# Patient Record
Sex: Male | Born: 1985 | Hispanic: Yes | Marital: Single | Smoking: Current some day smoker
Health system: Southern US, Community
[De-identification: ages and names within clinical notes are randomized; demographics above are authoritative.]

## PROBLEM LIST (undated history)

## (undated) ENCOUNTER — Ambulatory Visit: Admission: EM | Discharge status: Absent without leave | Payer: Federal, State, Local not specified - PPO

## (undated) DIAGNOSIS — G8929 Other chronic pain: Secondary | ICD-10-CM

## (undated) DIAGNOSIS — G43909 Migraine, unspecified, not intractable, without status migrainosus: Secondary | ICD-10-CM

## (undated) DIAGNOSIS — M549 Dorsalgia, unspecified: Secondary | ICD-10-CM

## (undated) HISTORY — PX: BACK SURGERY: SHX140

## (undated) HISTORY — PX: MOUTH SURGERY: SHX715

## (undated) HISTORY — PX: SHOULDER SURGERY: SHX246

---

## 2008-10-23 ENCOUNTER — Emergency Department: Payer: Self-pay | Admitting: Emergency Medicine

## 2013-03-28 HISTORY — PX: SEPTOPLASTY: SUR1290

## 2013-03-28 HISTORY — PX: RHINOPLASTY: SUR1284

## 2014-04-19 ENCOUNTER — Emergency Department (HOSPITAL_COMMUNITY): Payer: Self-pay

## 2014-04-19 ENCOUNTER — Encounter (HOSPITAL_COMMUNITY): Payer: Self-pay

## 2014-04-19 ENCOUNTER — Emergency Department (HOSPITAL_COMMUNITY)
Admission: EM | Admit: 2014-04-19 | Discharge: 2014-04-19 | Disposition: A | Discharge status: Home / Self Care | Payer: Self-pay | Attending: Emergency Medicine | Admitting: Emergency Medicine

## 2014-04-19 DIAGNOSIS — S43421A Sprain of right rotator cuff capsule, initial encounter: Secondary | ICD-10-CM | POA: Insufficient documentation

## 2014-04-19 DIAGNOSIS — S3992XA Unspecified injury of lower back, initial encounter: Secondary | ICD-10-CM | POA: Insufficient documentation

## 2014-04-19 DIAGNOSIS — S46911A Strain of unspecified muscle, fascia and tendon at shoulder and upper arm level, right arm, initial encounter: Secondary | ICD-10-CM | POA: Insufficient documentation

## 2014-04-19 DIAGNOSIS — T1490XA Injury, unspecified, initial encounter: Secondary | ICD-10-CM

## 2014-04-19 DIAGNOSIS — S4991XA Unspecified injury of right shoulder and upper arm, initial encounter: Secondary | ICD-10-CM

## 2014-04-19 DIAGNOSIS — Y998 Other external cause status: Secondary | ICD-10-CM | POA: Insufficient documentation

## 2014-04-19 DIAGNOSIS — S0003XA Contusion of scalp, initial encounter: Secondary | ICD-10-CM | POA: Insufficient documentation

## 2014-04-19 DIAGNOSIS — Z79899 Other long term (current) drug therapy: Secondary | ICD-10-CM | POA: Insufficient documentation

## 2014-04-19 DIAGNOSIS — Y92410 Unspecified street and highway as the place of occurrence of the external cause: Secondary | ICD-10-CM | POA: Insufficient documentation

## 2014-04-19 DIAGNOSIS — S40211A Abrasion of right shoulder, initial encounter: Secondary | ICD-10-CM | POA: Insufficient documentation

## 2014-04-19 DIAGNOSIS — Y9389 Activity, other specified: Secondary | ICD-10-CM | POA: Insufficient documentation

## 2014-04-19 DIAGNOSIS — S46011A Strain of muscle(s) and tendon(s) of the rotator cuff of right shoulder, initial encounter: Secondary | ICD-10-CM

## 2014-04-19 DIAGNOSIS — S199XXA Unspecified injury of neck, initial encounter: Secondary | ICD-10-CM | POA: Insufficient documentation

## 2014-04-19 DIAGNOSIS — Z87891 Personal history of nicotine dependence: Secondary | ICD-10-CM | POA: Insufficient documentation

## 2014-04-19 MED ORDER — OXYCODONE-ACETAMINOPHEN 5-325 MG PO TABS: 1.0000 | ORAL_TABLET | Freq: Four times a day (QID) | ORAL | Status: DC | PRN

## 2014-04-19 MED ORDER — IBUPROFEN 800 MG PO TABS: 800.0000 mg | ORAL_TABLET | Freq: Three times a day (TID) | ORAL | Status: DC

## 2014-04-19 MED ORDER — OXYCODONE-ACETAMINOPHEN 5-325 MG PO TABS
2.0000 | ORAL_TABLET | Freq: Once | ORAL | Status: AC
  Administered 2014-04-19: 2 via ORAL
  Filled 2014-04-19: qty 2

## 2014-04-19 NOTE — ED Notes (Signed)
Patient returned from CT

## 2014-04-19 NOTE — Discharge Instructions (Signed)
Take motrin for pain.   Take percocet for severe pain. Do NOT drive with it.   No heavy lifting until you see ortho.   Use sling for comfort.   Return to ED if you have severe pain, vomiting, worse shoulder pain.
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
## 2014-04-19 NOTE — ED Notes (Addendum)
Patient transported to Radiology 

## 2014-08-12 ENCOUNTER — Emergency Department
Admission: EM | Admit: 2014-08-12 | Discharge: 2014-08-12 | Disposition: A | Discharge status: Home / Self Care | Payer: Self-pay | Attending: Emergency Medicine | Admitting: Emergency Medicine

## 2014-08-12 ENCOUNTER — Encounter: Payer: Self-pay | Admitting: Emergency Medicine

## 2014-08-12 DIAGNOSIS — Y998 Other external cause status: Secondary | ICD-10-CM | POA: Insufficient documentation

## 2014-08-12 DIAGNOSIS — Z87891 Personal history of nicotine dependence: Secondary | ICD-10-CM | POA: Insufficient documentation

## 2014-08-12 DIAGNOSIS — Y92009 Unspecified place in unspecified non-institutional (private) residence as the place of occurrence of the external cause: Secondary | ICD-10-CM | POA: Insufficient documentation

## 2014-08-12 DIAGNOSIS — Z791 Long term (current) use of non-steroidal anti-inflammatories (NSAID): Secondary | ICD-10-CM | POA: Insufficient documentation

## 2014-08-12 DIAGNOSIS — T7840XA Allergy, unspecified, initial encounter: Secondary | ICD-10-CM

## 2014-08-12 DIAGNOSIS — T782XXA Anaphylactic shock, unspecified, initial encounter: Secondary | ICD-10-CM | POA: Insufficient documentation

## 2014-08-12 DIAGNOSIS — Y9389 Activity, other specified: Secondary | ICD-10-CM | POA: Insufficient documentation

## 2014-08-12 DIAGNOSIS — Z79899 Other long term (current) drug therapy: Secondary | ICD-10-CM | POA: Insufficient documentation

## 2014-08-12 DIAGNOSIS — X58XXXA Exposure to other specified factors, initial encounter: Secondary | ICD-10-CM | POA: Insufficient documentation

## 2014-08-12 HISTORY — DX: Other chronic pain: G89.29

## 2014-08-12 HISTORY — DX: Dorsalgia, unspecified: M54.9

## 2014-08-12 MED ORDER — PREDNISONE 20 MG PO TABS: ORAL_TABLET | ORAL | Status: DC

## 2014-08-12 MED ORDER — DIPHENHYDRAMINE HCL 50 MG/ML IJ SOLN
INTRAMUSCULAR | Status: AC
  Administered 2014-08-12: 12.5 mg via INTRAVENOUS
  Filled 2014-08-12: qty 1

## 2014-08-12 MED ORDER — SODIUM CHLORIDE 0.9 % IV SOLN
Freq: Once | INTRAVENOUS | Status: AC
  Administered 2014-08-12: 10:00:00 via INTRAVENOUS

## 2014-08-12 MED ORDER — DIPHENHYDRAMINE HCL 50 MG/ML IJ SOLN
12.5000 mg | Freq: Once | INTRAMUSCULAR | Status: AC
  Administered 2014-08-12: 12.5 mg via INTRAVENOUS

## 2014-08-12 MED ORDER — METHYLPREDNISOLONE SODIUM SUCC 40 MG IJ SOLR
INTRAMUSCULAR | Status: AC
  Administered 2014-08-12: 40 mg
  Filled 2014-08-12: qty 2

## 2014-08-12 MED ORDER — METHYLPREDNISOLONE SODIUM SUCC 125 MG IJ SOLR
80.0000 mg | Freq: Once | INTRAMUSCULAR | Status: AC
  Administered 2014-08-12: 80 mg via INTRAVENOUS

## 2014-08-12 MED ORDER — EPINEPHRINE 0.3 MG/0.3ML IJ SOAJ: 0.3000 mg | Freq: Once | INTRAMUSCULAR | Status: DC

## 2014-08-12 NOTE — ED Notes (Signed)
Pt with sudden onset of allergic reaction at 09:45 for unknown source however reports a "bump" behind right ear.  Pt appears in no resp distress, states he gave himself epi pen and 2 po benadryl. Redness noted to eyes bilaterally, trunk, face, and arms. Pts wife states he had hives on his back that are now gone.

## 2014-08-12 NOTE — Discharge Instructions (Signed)
Take prednisone as prescribed. Refill the EpiPen and keep your you if needed. Return to the emergency department if you're reaction begins to worsen, especially if it worsens to the point that she needed to use the EpiPen again.  Anaphylactic Reaction An anaphylactic reaction is a sudden, severe allergic reaction. It affects the whole body. It can be life threatening. You may need to stay in the hospital.  Chinle a medical bracelet or necklace that lists your allergy.  Carry your allergy kit or medicine shot to treat severe allergic reactions with you. These can save your life.  Do not drive until medicine from your shot has worn off, unless your doctor says it is okay.  If you have hives or a rash:  Take medicine as told by your doctor.  You may take over-the-counter antihistamine medicine.  Place cold cloths on your skin. Take baths in cool water. Avoid hot baths and hot showers. GET HELP RIGHT AWAY IF:   Your mouth is puffy (swollen), or you have trouble breathing.  You start making whistling sounds when you breathe (wheezing).  You have a tight feeling in your chest or throat.  You have a rash, hives, puffiness, or itching on your body.  You throw up (vomit) or have watery poop (diarrhea).  You feel dizzy or pass out (faint).  You think you are having an allergic reaction.  You have new symptoms. This is an emergency. Use your medicine shot or allergy kit as told. Call your local emergency services (911 in [place] Even if you feel better after the shot, you need to go to the hospital emergency department. MAKE SURE YOU:   Understand these instructions.  Will watch your condition.  Will get help right away if you are not doing well or get worse. Document Released: 08/31/2007 Document Revised: 09/13/2011 Document Reviewed: 06/15/2011 [facility] Patient Information 2015 [place], [place]. This information is not intended to replace advice given to you by your health  care provider. Make sure you discuss any questions you have with your health care provider.

## 2014-08-12 NOTE — ED Provider Notes (Signed)
[facility] Emergency Department Provider Note  ____________________________________________  Time seen: [id number]  I have reviewed the triage vital signs and the nursing notes.   HISTORY  Chief Complaint Allergic Reaction   HPI Steven Mccoy is a 29 y.o. 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 He has an EpiPen at home. This morning, just 15 minutes prior to arrival in the emergency department, he felt hives coming up and he began to turn red. He is not sure what triggered this. There was no bee string. He was on the floor and thought he might of had a bug bite. He noticed a bump on his scalp just above his right ear. He also reports that he has taken some naproxen and recently and he wonders if that may have triggered the reaction.The patient used his EpiPen at home and took 50 mg of Benadryl by mouth and came to the emergency department.   Past Medical History  Diagnosis Date  . Chronic back pain greater than 3 months duration     Right shoulder pain-chronic    There are no active problems to display for this patient.   Past Surgical History  Procedure Laterality Date  . Septoplasty  2015  . Rhinoplasty  2015    Current Outpatient Rx  Name  Route  Sig  Dispense  Refill  . buPROPion (ZYBAN) 150 MG 12 hr tablet   Oral   Take 150 mg by mouth 2 (two) times daily.         . citalopram (CELEXA) 20 MG tablet   Oral   Take 20 mg by mouth daily.         . cyclobenzaprine (FLEXERIL) 10 MG tablet   Oral   Take 1 tablet by mouth every 8 (eight) hours as needed. For muscle tightness      2   . meloxicam (MOBIC) 15 MG tablet   Oral   Take 1 tablet by mouth daily.      3   . traMADol (ULTRAM) 50 MG tablet   Oral   Take 50 mg by mouth every 6 (six) hours as needed. for pain      0   . zolpidem (AMBIEN) 10 MG tablet   Oral   Take 10 mg by mouth at bedtime as needed for sleep.         Marland Kitchen. EPINEPHrine 0.3  mg/0.3 mL IJ SOAJ injection   Intramuscular   Inject 0.3 mLs (0.3 mg total) into the muscle once.   1 Device   2   . oxyCODONE-acetaminophen (PERCOCET) 5-325 MG per tablet   Oral   Take 1 tablet by mouth every 6 (six) hours as needed.   10 tablet   0   . predniSONE (DELTASONE) 20 MG tablet      Take 2 tablets the first and second day,  then take one tablet a day for  more days.   7 tablet   0     Allergies Bee venom  History reviewed. No pertinent family history.  Social History History  Substance Use Topics  . Smoking status: Former Games developermoker  . Smokeless tobacco: Not on file  . Alcohol Use: No    Review of Systems  Constitutional: Negative for fever. ENT: Negative for sore throat. Cardiovascular: Negative for chest pain, notable for tachycardia on arrival see history of present illness. Respiratory: Negative for shortness of breath. Gastrointestinal: Negative for abdominal pain, vomiting and diarrhea.  Genitourinary: Negative for dysuria. Musculoskeletal: Negative for back pain. Skin: Negative for rash. Neurological: Negative for headaches   10-point ROS otherwise negative.  ____________________________________________   PHYSICAL EXAM:  VITAL SIGNS: ED Triage Vitals  Enc Vitals Group     BP --      Pulse --      Resp --      Temp --      Temp src --      SpO2 --      Weight --      Height --      Head Cir --      Peak Flow --      Pain Score --      Pain Loc --      Pain Edu? --      Excl. in GC? --     Constitutional: Alert and oriented. Mildly tachypnea, appears slightly uncomfortable, he is communicative. Elevated heart rate at 130 with normal blood pressure. ENT   Head: Normocephalic and atraumatic.   Nose: No congestion/rhinnorhea.   Mouth/Throat: Mucous membranes are moist. Cardiovascular: Tachycardic at 130  Respiratory: Normal respiratory effort without tachypnea. Breath sounds are clear and equal bilaterally. No  wheezes/rales/rhonchi. Gastrointestinal: Soft and nontender. No distention.  Back: There is no CVA tenderness. Musculoskeletal: Nontender with normal range of motion in all extremities.  No noted edema. Neurologic:  Normal speech and language. No gross focal neurologic deficits are appreciated.  Skin:  Diffuse erythema. No welts noted. Psychiatric: Mood and affect are normal. Speech and behavior are normal.  ____________________________________________    LABS (pertinent positives/negatives)    ____________________________________________   EKG   ____________________________________________   PROCEDURES  Procedure(s) performed: None  Critical Care performed: No  ____________________________________________   INITIAL IMPRESSION / ASSESSMENT AND PLAN / ED COURSE  Patient has an anaphylactic reaction of unknown etiology. He has artery pretreated with epinephrine and Benadryl prior to arrival. We will add to this 12-1/2 mg of Benadryl by IV as well as 80 mg of Solu-Medrol by IV and 1 L of normal saline.  ----------------------------------------- 2:22 PM on 08/12/2014 -----------------------------------------  Reexamination: Patient has complete resolution of his skin redness/rash and of his other symptoms. He looks well. We will discharge with a perception for prednisone for the next 5 days and with a refill prescription for EpiPen to be used when necessary. ____________________________________________   FINAL CLINICAL IMPRESSION(S) / ED DIAGNOSES  Final diagnoses:  Anaphylaxis, initial encounter  Allergic reaction, initial encounter      Darien Ramusavid W Shelli Portilla, MD 08/12/14 1558
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
## 2016-10-11 ENCOUNTER — Encounter (HOSPITAL_COMMUNITY): Payer: Self-pay

## 2016-10-11 ENCOUNTER — Emergency Department (HOSPITAL_COMMUNITY)
Admission: EM | Admit: 2016-10-11 | Discharge: 2016-10-11 | Disposition: A | Discharge status: Home / Self Care | Payer: Self-pay | Attending: Emergency Medicine | Admitting: Emergency Medicine

## 2016-10-11 DIAGNOSIS — Z79899 Other long term (current) drug therapy: Secondary | ICD-10-CM | POA: Insufficient documentation

## 2016-10-11 DIAGNOSIS — Z87891 Personal history of nicotine dependence: Secondary | ICD-10-CM | POA: Insufficient documentation

## 2016-10-11 DIAGNOSIS — A09 Infectious gastroenteritis and colitis, unspecified: Secondary | ICD-10-CM | POA: Insufficient documentation

## 2016-10-11 LAB — CBC WITH DIFFERENTIAL/PLATELET
BASOS ABS: 0 10*3/uL (ref 0.0–0.1)
BASOS PCT: 0 %
Eosinophils Absolute: 0.1 10*3/uL (ref 0.0–0.7)
Eosinophils Relative: 1 %
HCT: 44.3 % (ref 39.0–52.0)
Hemoglobin: 14.5 g/dL (ref 13.0–17.0)
Lymphocytes Relative: 18 %
Lymphs Abs: 1.7 10*3/uL (ref 0.7–4.0)
MCH: 27.9 pg (ref 26.0–34.0)
MCHC: 32.7 g/dL (ref 30.0–36.0)
MCV: 85.2 fL (ref 78.0–100.0)
MONO ABS: 0.6 10*3/uL (ref 0.1–1.0)
Monocytes Relative: 6 %
Neutro Abs: 7 10*3/uL (ref 1.7–7.7)
Neutrophils Relative %: 75 %
Platelets: 159 10*3/uL (ref 150–400)
RBC: 5.2 MIL/uL (ref 4.22–5.81)
RDW: 12.8 % (ref 11.5–15.5)
WBC: 9.4 10*3/uL (ref 4.0–10.5)

## 2016-10-11 LAB — URINALYSIS, ROUTINE W REFLEX MICROSCOPIC
Bilirubin Urine: NEGATIVE
Glucose, UA: NEGATIVE mg/dL
Hgb urine dipstick: NEGATIVE
KETONES UR: 20 mg/dL — AB
Leukocytes, UA: NEGATIVE
Nitrite: NEGATIVE
PROTEIN: 30 mg/dL — AB
Specific Gravity, Urine: 1.023 (ref 1.005–1.030)
Squamous Epithelial / LPF: NONE SEEN
pH: 5 (ref 5.0–8.0)

## 2016-10-11 LAB — COMPREHENSIVE METABOLIC PANEL
ALBUMIN: 4.3 g/dL (ref 3.5–5.0)
ALT: 17 U/L (ref 17–63)
AST: 19 U/L (ref 15–41)
Alkaline Phosphatase: 57 U/L (ref 38–126)
Anion gap: 9 (ref 5–15)
BUN: 7 mg/dL (ref 6–20)
CO2: 25 mmol/L (ref 22–32)
Calcium: 9.2 mg/dL (ref 8.9–10.3)
Chloride: 102 mmol/L (ref 101–111)
Creatinine, Ser: 1.07 mg/dL (ref 0.61–1.24)
GFR calc Af Amer: >60 mL/min (ref >60)
Glucose, Bld: 110 mg/dL — ABNORMAL HIGH (ref 65–99)
Potassium: 3.6 mmol/L (ref 3.5–5.1)
Sodium: 136 mmol/L (ref 135–145)
TOTAL PROTEIN: 7.6 g/dL (ref 6.5–8.1)
Total Bilirubin: 1 mg/dL (ref 0.3–1.2)

## 2016-10-11 LAB — PROTIME-INR
INR: 1.12
Prothrombin Time: 14.4 seconds (ref 11.4–15.2)

## 2016-10-11 LAB — I-STAT CG4 LACTIC ACID, ED: LACTIC ACID, VENOUS: 1.13 mmol/L (ref 0.5–1.9)

## 2016-10-11 MED ORDER — CIPROFLOXACIN HCL 500 MG PO TABS: 500.0000 mg | ORAL_TABLET | Freq: Two times a day (BID) | ORAL | 0 refills | Status: DC

## 2016-10-11 MED ORDER — CIPROFLOXACIN HCL 500 MG PO TABS
500.0000 mg | ORAL_TABLET | Freq: Once | ORAL | Status: AC
  Administered 2016-10-11: 500 mg via ORAL
  Filled 2016-10-11: qty 1

## 2016-10-11 MED ORDER — ACETAMINOPHEN 325 MG PO TABS
650.0000 mg | ORAL_TABLET | Freq: Once | ORAL | Status: AC
  Administered 2016-10-11: 650 mg via ORAL
  Filled 2016-10-11: qty 2

## 2016-10-11 MED ORDER — SODIUM CHLORIDE 0.9 % IV BOLUS (SEPSIS): 1000.0000 mL | Freq: Once | INTRAVENOUS | Status: DC

## 2016-10-11 NOTE — ED Provider Notes (Signed)
[facility] Provider Note   CSN: [id number] Arrival date & time: 10/11/16  1447     History   Chief Complaint Chief Complaint  Patient presents with  . Fever  . Diarrhea    HPI Steven Mccoy is a 31 y.o. male who presents with abdominal pain and diarrhea. He states that he was in [place] for 5 days and he got back 2 days ago. Yesterday he developed fever, chills, generalized abdominal cramping, multiple episodes of diarrhea which have become bloody. He states his son has had similar symptoms and is currently hospitalized and has tested positive for multiple forms of Escherichia coli. On review of EMR he tested positive for Enteroaggregative E coli , Enterotoxigenic E coli (ETEC), Shiga like toxin producing E coli (STEC), E. coli O157. He is taking ibuprofen every 4 hours minimal relief. He reports associated tenesmus, decreased appetite, and fatigue. No N/V. He states he has chronic back pain which has worsened due to the chills and abdominal pain.  HPI  Past Medical History:  Diagnosis Date  . Chronic back pain greater than 3 months duration    Right shoulder pain-chronic    There are no active problems to display for this patient.   Past Surgical History:  Procedure Laterality Date  . RHINOPLASTY  2015  . SEPTOPLASTY  2015       Home Medications    Prior to Admission medications   Medication Sig Start Date End Date Taking? Authorizing Provider  buPROPion (ZYBAN) 150 MG 12 hr tablet Take 150 mg by mouth 2 (two) times daily.    [provider]  citalopram (CELEXA) 20 MG tablet Take 20 mg by mouth daily.    [provider]  cyclobenzaprine (FLEXERIL) 10 MG tablet Take 1 tablet by mouth every 8 (eight) hours as needed. For muscle tightness 07/21/14   [provider]  EPINEPHrine 0.3 mg/0.3 mL IJ SOAJ injection Inject 0.3 mLs (0.3 mg total) into the muscle once. 08/12/14   Darien RamusKaminski, David W, MD  meloxicam (MOBIC) 15 MG tablet  Take 1 tablet by mouth daily. 07/29/14   [provider]  oxyCODONE-acetaminophen (PERCOCET) 5-325 MG per tablet Take 1 tablet by mouth every 6 (six) hours as needed. 04/19/14   Charlynne PanderYao, David Hsienta, MD  predniSONE (DELTASONE) 20 MG tablet Take 2 tablets the first and second day,  then take one tablet a day for  more days. 08/12/14   Darien RamusKaminski, David W, MD  traMADol (ULTRAM) 50 MG tablet Take 50 mg by mouth every 6 (six) hours as needed. for pain 07/29/14   [provider]  zolpidem (AMBIEN) 10 MG tablet Take 10 mg by mouth at bedtime as needed for sleep.    [provider]    Family History History reviewed. No pertinent family history.  Social History Social History  Substance Use Topics  . Smoking status: Former Games developermoker  . Smokeless tobacco: Not on file  . Alcohol use No     Allergies   Bee venom   Review of Systems Review of Systems  Constitutional: Positive for chills, fatigue and fever.  Gastrointestinal: Positive for abdominal pain, blood in stool and diarrhea. Negative for nausea and vomiting.  Genitourinary: Negative for dysuria.  Musculoskeletal: Positive for back pain (chronic).  All other systems reviewed and are negative.    Physical Exam Updated Vital Signs BP (!) 145/93 (BP Location: Left Arm)   Pulse 100   Temp 97.9 F (36.6 C) (Oral)  Resp 18   Ht 5\' 3"  (1.6 m)   Wt 68 kg (150 lb)   SpO2 100%   BMI 26.57 kg/m   Physical Exam  Constitutional: He is oriented to person, place, and time. He appears well-developed and well-nourished. No distress.  HENT:  Head: Normocephalic and atraumatic.  Eyes: Pupils are equal, round, and reactive to light. Conjunctivae are normal. Right eye exhibits no discharge. Left eye exhibits no discharge. No scleral icterus.  Neck: Normal range of motion.  Cardiovascular: Regular rhythm.  Tachycardia present.  Exam reveals no gallop and no friction rub.   No murmur heard. Pulmonary/Chest: Effort normal and  breath sounds normal. No respiratory distress. He has no wheezes. He has no rales. He exhibits no tenderness.  Abdominal: Soft. Bowel sounds are normal. He exhibits no distension and no mass. There is no tenderness. There is no rebound and no guarding. No hernia.  Neurological: He is alert and oriented to person, place, and time.  Skin: Skin is warm and dry.  Psychiatric: He has a normal mood and affect. His behavior is normal.  Nursing note and vitals reviewed.    ED Treatments / Results  Labs (all labs ordered are listed, but only abnormal results are displayed) Labs Reviewed  COMPREHENSIVE METABOLIC PANEL - Abnormal; Notable for the following:       Result Value   Glucose, Bld 110 (*)    All other components within normal limits  URINALYSIS, ROUTINE W REFLEX MICROSCOPIC - Abnormal; Notable for the following:    Ketones, ur 20 (*)    Protein, ur 30 (*)    Bacteria, UA RARE (*)    All other components within normal limits  CULTURE, BLOOD (ROUTINE X 2)  GASTROINTESTINAL PANEL BY PCR, STOOL (REPLACES STOOL CULTURE)  CBC WITH DIFFERENTIAL/PLATELET  PROTIME-INR  I-STAT CG4 LACTIC ACID, ED    EKG  EKG Interpretation None       Radiology No results found.  Procedures Procedures (including critical care time)  Medications Ordered in ED Medications  acetaminophen (TYLENOL) tablet 650 mg (650 mg Oral Given 10/11/16 1755)  ciprofloxacin (CIPRO) tablet 500 mg (500 mg Oral Given 10/11/16 1835)     Initial Impression / Assessment and Plan / ED Course  I have reviewed the triage vital signs and the nursing notes.  Pertinent labs & imaging results that were available during my care of the patient were reviewed by me and considered in my medical decision making (see chart for details).  31 year old male presents with traveler's diarrhea. He is febrile and tachycardic in the ED. Tylenol was given. Abdomen is soft, non-tender. CBC is unremarkable. CMP unremarkable. Lactic acid is  normal. UA is clean. GI panel was collected. He is able to tolerate PO. Advised Cipro 500mg  BID for 5 days and orally rehydrate. Return precautions were given.  Final Clinical Impressions(s) / ED Diagnoses   Final diagnoses:  Infectious diarrhea in adult patient    New Prescriptions Discharge Medication List as of 10/11/2016  7:15 PM    START taking these medications   Details  ciprofloxacin (CIPRO) 500 MG tablet Take 1 tablet (500 mg total) by mouth 2 (two) times daily., Starting Tue 10/11/2016, Print         Bethel Born, PA-C 10/11/16 2005    Melene Plan, DO 10/11/16 2313

## 2016-10-11 NOTE — Discharge Instructions (Signed)
Take Cipro twice a day for 5 days Drink plenty of fluids You can take Immodium (anti-diarrhea medicine) until diarrhea stops Return for worsening symptoms

## 2016-10-11 NOTE — ED Triage Notes (Signed)
Pt endorses diarrhea, abd pain and fever x 2 days. Pt got back from [place] 2 days ago and his son, his son is admitted to the PICU and tested positive for e. Coli. Pt observed to be shivering in triage, pt last took ibuprofen for fever 4 hours ago. Afebrile in triage. VSS.

## 2016-10-12 LAB — GASTROINTESTINAL PANEL BY PCR, STOOL (REPLACES STOOL CULTURE)
ADENOVIRUS F40/41: NOT DETECTED
Astrovirus: NOT DETECTED
CRYPTOSPORIDIUM: NOT DETECTED
Campylobacter species: DETECTED — AB
Cyclospora cayetanensis: NOT DETECTED
ENTEROAGGREGATIVE E COLI (EAEC): NOT DETECTED
ENTEROPATHOGENIC E COLI (EPEC): DETECTED — AB
Entamoeba histolytica: NOT DETECTED
Enterotoxigenic E coli (ETEC): NOT DETECTED
GIARDIA LAMBLIA: NOT DETECTED
NOROVIRUS GI/GII: NOT DETECTED
Plesimonas shigelloides: NOT DETECTED
Rotavirus A: NOT DETECTED
SHIGELLA/ENTEROINVASIVE E COLI (EIEC): NOT DETECTED
Salmonella species: NOT DETECTED
Sapovirus (I, II, IV, and V): NOT DETECTED
Shiga like toxin producing E coli (STEC): NOT DETECTED
Vibrio cholerae: NOT DETECTED
Vibrio species: NOT DETECTED
Yersinia enterocolitica: NOT DETECTED

## 2016-10-16 LAB — CULTURE, BLOOD (ROUTINE X 2)
CULTURE: NO GROWTH
Special Requests: ADEQUATE

## 2018-12-03 ENCOUNTER — Ambulatory Visit
Admission: EM | Admit: 2018-12-03 | Discharge: 2018-12-03 | Disposition: A | Discharge status: Home / Self Care | Payer: Federal, State, Local not specified - PPO | Attending: Family Medicine | Admitting: Family Medicine

## 2018-12-03 ENCOUNTER — Encounter: Payer: Self-pay | Admitting: Emergency Medicine

## 2018-12-03 ENCOUNTER — Other Ambulatory Visit: Payer: Self-pay

## 2018-12-03 DIAGNOSIS — R21 Rash and other nonspecific skin eruption: Secondary | ICD-10-CM

## 2018-12-03 MED ORDER — PREDNISONE 10 MG PO TABS: ORAL_TABLET | ORAL | 0 refills | Status: DC

## 2018-12-03 NOTE — ED Provider Notes (Signed)
[facility]    CSN: [id number] Arrival date & time: 12/03/18  1233  History   Chief Complaint Chief Complaint  Patient presents with  . Rash    HPI  33 year old male presents with rash.  Patient has recently been doing yard work outside.  Developed rash yesterday which worsened last night.  He reports intensely pruritic rash.  Interferes with sleep.  Patient states that he had some leftover methylprednisolone and took 4 doses over the past 24 hours.  He states that he is improved but his rash has not resolved.  His son has a rash as well.  Continues to have severe itching.  Has had some relief.  No other reported symptoms.  No other complaints.  PMH, Surgical Hx, Family Hx, Social History reviewed and updated as below.  Past Medical History:  Diagnosis Date  . Chronic back pain greater than 3 months duration    Right shoulder pain-chronic   Past Surgical History:  Procedure Laterality Date  . MOUTH SURGERY    . RHINOPLASTY  2015  . SEPTOPLASTY  2015  . SHOULDER SURGERY     Home Medications    Prior to Admission medications   Medication Sig Start Date End Date Taking? Authorizing Provider  zolpidem (AMBIEN) 10 MG tablet Take 10 mg by mouth at bedtime as needed for sleep.  12/03/18 Yes [provider]  meloxicam (MOBIC) 15 MG tablet Take 1 tablet by mouth daily. 07/29/14   [provider]  predniSONE (DELTASONE) 10 MG tablet 50 mg daily x 2 days, then 40 mg daily x 2 days, then 30 mg daily x 2 days, then 20 mg daily x 2 days, then 10 mg daily x 2 days. 12/03/18   Coral Spikes, DO  citalopram (CELEXA) 20 MG tablet Take 20 mg by mouth daily.  12/03/18  [provider]   Family History Family History  Problem Relation Age of Onset  . Hypertension Father   . Diabetes Father   . Heart disease Father    Social History Social History   Tobacco Use  . Smoking status: Former Research scientist (life sciences)  . Smokeless tobacco: Never Used  Substance Use Topics  .  Alcohol use: No  . Drug use: No   Allergies   Bee venom   Review of Systems Review of Systems  Constitutional: Negative.   Skin: Positive for rash.   Physical Exam Triage Vital Signs ED Triage Vitals [12/03/18 1311]  Enc Vitals Group     BP 127/81     Pulse Rate 83     Resp 18     Temp 98.1 F (36.7 C)     Temp Source Oral     SpO2 98 %     Weight      Height      Head Circumference      Peak Flow      Pain Score 0     Pain Loc      Pain Edu?      Excl. in [place]?    Updated Vital Signs BP 127/81 (BP Location: Left Arm)   Pulse 83   Temp 98.1 F (36.7 C) (Oral)   Resp 18   SpO2 98%   Visual Acuity Right Eye Distance:   Left Eye Distance:   Bilateral Distance:    Right Eye Near:   Left Eye Near:    Bilateral Near:     Physical Exam Vitals signs and nursing note reviewed.  Constitutional:  General: He is not in acute distress.    Appearance: Normal appearance.  HENT:     Head: Normocephalic and atraumatic.     Nose: Nose normal.  Eyes:     General:        Right eye: No discharge.        Left eye: No discharge.     Conjunctiva/sclera: Conjunctivae normal.  Pulmonary:     Effort: Pulmonary effort is normal. No respiratory distress.  Skin:    Comments: Neck, abdomen with scattered areas of erythematous, papular rash.  Neurological:     General: No focal deficit present.     Mental Status: He is alert and oriented to person, place, and time.  Psychiatric:        Mood and Affect: Mood normal.        Behavior: Behavior normal.    UC Treatments / Results  Labs (all labs ordered are listed, but only abnormal results are displayed) Labs Reviewed - No data to display  EKG   Radiology No results found.  Procedures Procedures (including critical care time)  Medications Ordered in UC Medications - No data to display  Initial Impression / Assessment and Plan / UC Course  I have reviewed the triage vital signs and the nursing notes.   Pertinent labs & imaging results that were available during my care of the patient were reviewed by me and considered in my medical decision making (see chart for details).    33 year old male presents with rash.  Suspect dermatitis secondary to poison oak or ivy.  Treating with prednisone.  Final Clinical Impressions(s) / UC Diagnoses   Final diagnoses:  Rash     Discharge Instructions     Prednisone as prescribed.  Take care  Dr. Adriana Simasook    ED Prescriptions    Medication Sig Dispense Auth. Provider   predniSONE (DELTASONE) 10 MG tablet 50 mg daily x 2 days, then 40 mg daily x 2 days, then 30 mg daily x 2 days, then 20 mg daily x 2 days, then 10 mg daily x 2 days. 30 tablet Tommie Samsook, Ariela Mochizuki G, DO     Controlled Substance Prescriptions [place] Controlled Substance Registry consulted? Not Applicable   Tommie SamsCook, Penney Domanski G, DO 12/03/18 1504

## 2018-12-03 NOTE — ED Triage Notes (Signed)
Patient states he has been doing yard work and developed a rash all over his body

## 2018-12-03 NOTE — Discharge Instructions (Signed)
Prednisone as prescribed. ° °Take care ° °Dr. Sanjith Siwek  °

## 2019-02-20 ENCOUNTER — Ambulatory Visit
Admission: EM | Admit: 2019-02-20 | Discharge: 2019-02-20 | Disposition: A | Discharge status: Home / Self Care | Payer: Federal, State, Local not specified - PPO | Attending: Emergency Medicine | Admitting: Emergency Medicine

## 2019-02-20 ENCOUNTER — Other Ambulatory Visit: Payer: Self-pay

## 2019-02-20 DIAGNOSIS — U071 COVID-19: Secondary | ICD-10-CM | POA: Diagnosis not present

## 2019-02-20 DIAGNOSIS — R0602 Shortness of breath: Secondary | ICD-10-CM | POA: Diagnosis not present

## 2019-02-20 DIAGNOSIS — M791 Myalgia, unspecified site: Secondary | ICD-10-CM

## 2019-02-20 DIAGNOSIS — R39198 Other difficulties with micturition: Secondary | ICD-10-CM | POA: Diagnosis not present

## 2019-02-20 DIAGNOSIS — G8929 Other chronic pain: Secondary | ICD-10-CM | POA: Diagnosis not present

## 2019-02-20 DIAGNOSIS — M546 Pain in thoracic spine: Secondary | ICD-10-CM | POA: Diagnosis not present

## 2019-02-20 DIAGNOSIS — J029 Acute pharyngitis, unspecified: Secondary | ICD-10-CM

## 2019-02-20 DIAGNOSIS — R05 Cough: Secondary | ICD-10-CM

## 2019-02-20 LAB — SARS CORONAVIRUS 2 AG (30 MIN TAT): SARS Coronavirus 2 Ag: POSITIVE — AB

## 2019-02-20 MED ORDER — BENZONATATE 200 MG PO CAPS: 200.0000 mg | ORAL_CAPSULE | Freq: Three times a day (TID) | ORAL | 0 refills | Status: DC | PRN

## 2019-02-20 MED ORDER — HYDROCOD POLST-CPM POLST ER 10-8 MG/5ML PO SUER: 5.0000 mL | Freq: Two times a day (BID) | ORAL | 0 refills | Status: DC | PRN

## 2019-02-20 MED ORDER — FLUTICASONE PROPIONATE 50 MCG/ACT NA SUSP: 2.0000 | Freq: Every day | NASAL | 0 refills | Status: AC

## 2019-02-20 NOTE — Discharge Instructions (Signed)
Flonase, saline nasal irrigation with a Milta Deiters med rinse and distilled water as often as you want.  600 mg of ibuprofen combined with 1 g of Tylenol 3-4 times a day as needed for body aches, headaches, fever.  Try some regular Mucinex.  Tessalon for the cough during the day, Tussionex for the cough at night.

## 2019-02-20 NOTE — ED Provider Notes (Signed)
HPI  SUBJECTIVE:  Steven Mccoy is a 33 y.o. male who presents with fevers to 103, body aches, headaches, sinus pain and pressure, nasal congestion, postnasal drip, sore throat, chest congestion, cough, shortness of breath, abdominal cramping, fatigue, anorexia starting last night.  He is not sure if he has lost his sense of smell or taste.  He denies nausea, vomiting, diarrhea.  He had a Covid exposure 1 week ago.  He reports some wheezing, but states that this resolved after doing some saline nasal irrigation.  States he is unable to sleep at night secondary to the cough.  He tried saline nasal rinses, ibuprofen 600 mg, ginger, turmeric, multivitamin IM.  He states that the nasal rinse and the multivitamin helped.  No aggravating factors.  His last dose of ibuprofen was in 4 to 6 hours of evaluation.  He also reports difficulty urinating when he has back pain.  States that he injured his back 4 years ago and has had problems initiating his urinary stream when his back hurts since then.  He has had a normal MRI since having the symptoms,, has been seen by urology at the [facility] for this.  He states that they checked his prostate and it was normal.  He is wondering if he could get a urology referral so that he can get a second opinion.    He smokes cigars.  He has a past medical history of thoracic back injury in 2016 with chronic back pain, hypertension.  No history of pulmonary disease, diabetes, coronary artery disease, chronic kidney disease, HIV, immunocompromise, cancer.  PMD: At the [place].   Past Medical History:  Diagnosis Date  . Chronic back pain greater than 3 months duration    Right shoulder pain-chronic    Past Surgical History:  Procedure Laterality Date  . MOUTH SURGERY    . RHINOPLASTY  2015  . SEPTOPLASTY  2015  . SHOULDER SURGERY      Family History  Problem Relation Age of Onset  . Hypertension Father   . Diabetes Father   . Heart disease Father     Social  History   Tobacco Use  . Smoking status: Current Every Day Smoker    Packs/day: 0.25    Types: Cigarettes  . Smokeless tobacco: Never Used  Substance Use Topics  . Alcohol use: No  . Drug use: No    No current facility-administered medications for this encounter.   Current Outpatient Medications:  .  cyclobenzaprine (FLEXERIL) 10 MG tablet, Take 10 mg by mouth 3 (three) times daily as needed for muscle spasms., Disp: , Rfl:  .  benzonatate (TESSALON) 200 MG capsule, Take 1 capsule (200 mg total) by mouth 3 (three) times daily as needed for cough., Disp: 30 capsule, Rfl: 0 .  chlorpheniramine-HYDROcodone (TUSSIONEX PENNKINETIC ER) 10-8 MG/5ML SUER, Take 5 mLs by mouth every 12 (twelve) hours as needed for cough., Disp: 60 mL, Rfl: 0 .  fluticasone (FLONASE) 50 MCG/ACT nasal spray, Place 2 sprays into both nostrils daily., Disp: 16 g, Rfl: 0  Allergies  Allergen Reactions  . Bee Venom Anaphylaxis     ROS  As noted in HPI.   Physical Exam  BP 136/76 (BP Location: Left Arm)   Pulse 80   Temp 99.1 F (37.3 C) (Oral)   Resp 18   Ht _0  (1.6 m)   Wt 70.3 kg   SpO2 100%   BMI 27.46 kg/m   Constitutional: Well developed, well nourished, no  acute distress Eyes:  EOMI, conjunctiva normal bilaterally HENT: Normocephalic, atraumatic,mucus membranes moist.  Positive nasal congestion.  Positive frontal sinus tenderness.  No maxillary sinus tenderness.  Erythematous oropharynx.  Tonsils normal without exudates.  No obvious postnasal drip, uvula midline. Neck: No cervical lymphadenopathy Respiratory: Normal inspiratory effort, lungs clear bilaterally Cardiovascular: Normal rate regular rhythm no murmurs rubs or gallops GI: nondistended skin: No rash, skin intact Musculoskeletal: no deformities Neurologic: Alert & oriented x 3, no focal neuro deficits Psychiatric: Speech and behavior appropriate   ED Course   Medications - No data to display  Orders Placed This Encounter   Procedures  . SARS Coronavirus 2 Ag (30 min TAT) - Nasal Swab (BD Veritor Kit)    Standing Status:   Standing    Number of Occurrences:   1    Order Specific Question:   Is this test for diagnosis or screening    Answer:   Diagnosis of ill patient    Order Specific Question:   Symptomatic for COVID-19 as defined by CDC    Answer:   Yes    Order Specific Question:   Date of Symptom Onset    Answer:   02/19/2019    Order Specific Question:   Hospitalized for COVID-19    Answer:   No    Order Specific Question:   Admitted to ICU for COVID-19    Answer:   No    Order Specific Question:   Previously tested for COVID-19    Answer:   No    Order Specific Question:   Resident in a congregate (group) care setting    Answer:   No    Order Specific Question:   Employed in healthcare setting    Answer:   No    Results for orders placed or performed during the hospital encounter of 02/20/19 (from the past 24 hour(s))  SARS Coronavirus 2 Ag (30 min TAT) - Nasal Swab (BD Veritor Kit)     Status: Abnormal   Collection Time: 02/20/19  5:20 PM   Specimen: Nasal Swab (BD Veritor Kit)  Result Value Ref Range   SARS Coronavirus 2 Ag POSITIVE (A) NEGATIVE   No results found.  ED Clinical Impression  1. COVID-19 virus infection   2. Chronic thoracic back pain, unspecified back pain laterality   3. Difficulty urinating      ED Assessment/Plan  1.  Fever.  Rapid Covid positive.  Home with ibuprofen 600 mg combined with 1 g of Tylenol 3-4 times a day as needed for body aches, headaches, fevers, Flonase, saline nasal irrigation, regular Mucinex.  Tessalon for the cough during the day Tussionex for the cough at night. continue frequent saline nasal irrigation.  2.  Back pain with difficulty urinating.  Appears to be a chronic problem.  Patient has no issues with difficulty urinating at this point in time. Will provide information for Dr. Junious Silk, urology on call for a second opinion.  [place] Narcotic  database reviewed for this patient, and feel that the risk/benefit ratio today is favorable for proceeding with a prescription for controlled substance.  No opiate prescriptions in 2 years  Discussed labs,  MDM, treatment plan, and plan for follow-up with patient. Discussed sn/sx that should prompt return to the ED. patient agrees with plan.   Meds ordered this encounter  Medications  . fluticasone (FLONASE) 50 MCG/ACT nasal spray    Sig: Place 2 sprays into both nostrils daily.    Dispense:  16  g    Refill:  0  . chlorpheniramine-HYDROcodone (TUSSIONEX PENNKINETIC ER) 10-8 MG/5ML SUER    Sig: Take 5 mLs by mouth every 12 (twelve) hours as needed for cough.    Dispense:  60 mL    Refill:  0  . benzonatate (TESSALON) 200 MG capsule    Sig: Take 1 capsule (200 mg total) by mouth 3 (three) times daily as needed for cough.    Dispense:  30 capsule    Refill:  0    *This clinic note was created using [employer]. Therefore, there may be occasional mistakes despite careful proofreading.   ?    Melynda Ripple, MD 02/21/19 1213

## 2019-02-20 NOTE — ED Triage Notes (Addendum)
Patient complains of fever, body aches, chest pressure, sinus congestion x 1 days. Patient states that his fever has been constant. Patient was exposed by a person 1 week ago.

## 2019-02-25 ENCOUNTER — Other Ambulatory Visit: Payer: Self-pay

## 2019-02-25 ENCOUNTER — Encounter (HOSPITAL_BASED_OUTPATIENT_CLINIC_OR_DEPARTMENT_OTHER): Payer: Self-pay | Admitting: *Deleted

## 2019-02-25 ENCOUNTER — Emergency Department (HOSPITAL_BASED_OUTPATIENT_CLINIC_OR_DEPARTMENT_OTHER): Payer: No Typology Code available for payment source

## 2019-02-25 ENCOUNTER — Emergency Department (HOSPITAL_BASED_OUTPATIENT_CLINIC_OR_DEPARTMENT_OTHER)
Admission: EM | Admit: 2019-02-25 | Discharge: 2019-02-25 | Disposition: A | Discharge status: Home / Self Care | Payer: No Typology Code available for payment source | Attending: Emergency Medicine | Admitting: Emergency Medicine

## 2019-02-25 DIAGNOSIS — Z79899 Other long term (current) drug therapy: Secondary | ICD-10-CM | POA: Insufficient documentation

## 2019-02-25 DIAGNOSIS — U071 COVID-19: Secondary | ICD-10-CM | POA: Insufficient documentation

## 2019-02-25 DIAGNOSIS — J189 Pneumonia, unspecified organism: Secondary | ICD-10-CM | POA: Diagnosis not present

## 2019-02-25 DIAGNOSIS — F17210 Nicotine dependence, cigarettes, uncomplicated: Secondary | ICD-10-CM | POA: Diagnosis not present

## 2019-02-25 DIAGNOSIS — R509 Fever, unspecified: Secondary | ICD-10-CM | POA: Diagnosis present

## 2019-02-25 DIAGNOSIS — R079 Chest pain, unspecified: Secondary | ICD-10-CM | POA: Diagnosis not present

## 2019-02-25 DIAGNOSIS — R519 Headache, unspecified: Secondary | ICD-10-CM | POA: Insufficient documentation

## 2019-02-25 DIAGNOSIS — M7918 Myalgia, other site: Secondary | ICD-10-CM | POA: Insufficient documentation

## 2019-02-25 DIAGNOSIS — R0602 Shortness of breath: Secondary | ICD-10-CM | POA: Insufficient documentation

## 2019-02-25 LAB — CBC WITH DIFFERENTIAL/PLATELET
Abs Immature Granulocytes: 0.01 10*3/uL (ref 0.00–0.07)
Basophils Absolute: 0 10*3/uL (ref 0.0–0.1)
Basophils Relative: 0 %
Eosinophils Absolute: 0.1 10*3/uL (ref 0.0–0.5)
Eosinophils Relative: 2 %
HCT: 46.2 % (ref 39.0–52.0)
Hemoglobin: 15.7 g/dL (ref 13.0–17.0)
Immature Granulocytes: 0 %
Lymphocytes Relative: 28 %
Lymphs Abs: 2.2 10*3/uL (ref 0.7–4.0)
MCH: 29.3 pg (ref 26.0–34.0)
MCHC: 34 g/dL (ref 30.0–36.0)
MCV: 86.2 fL (ref 80.0–100.0)
Monocytes Absolute: 0.6 10*3/uL (ref 0.1–1.0)
Monocytes Relative: 8 %
Neutro Abs: 5 10*3/uL (ref 1.7–7.7)
Neutrophils Relative %: 62 %
Platelets: 160 10*3/uL (ref 150–400)
RBC: 5.36 MIL/uL (ref 4.22–5.81)
RDW: 12 % (ref 11.5–15.5)
WBC: 8 10*3/uL (ref 4.0–10.5)
nRBC: 0 % (ref 0.0–0.2)

## 2019-02-25 LAB — COMPREHENSIVE METABOLIC PANEL
ALT: 22 U/L (ref 0–44)
AST: 20 U/L (ref 15–41)
Albumin: 4.3 g/dL (ref 3.5–5.0)
Alkaline Phosphatase: 47 U/L (ref 38–126)
Anion gap: 8 (ref 5–15)
BUN: 11 mg/dL (ref 6–20)
CO2: 23 mmol/L (ref 22–32)
Calcium: 8.7 mg/dL — ABNORMAL LOW (ref 8.9–10.3)
Chloride: 108 mmol/L (ref 98–111)
Creatinine, Ser: 1.03 mg/dL (ref 0.61–1.24)
GFR calc Af Amer: >60 mL/min (ref >60)
GFR calc non Af Amer: >60 mL/min (ref >60)
Glucose, Bld: 95 mg/dL (ref 70–99)
Potassium: 3.8 mmol/L (ref 3.5–5.1)
Sodium: 139 mmol/L (ref 135–145)
Total Bilirubin: 0.4 mg/dL (ref 0.3–1.2)
Total Protein: 7.7 g/dL (ref 6.5–8.1)

## 2019-02-25 LAB — D-DIMER, QUANTITATIVE: D-Dimer, Quant: <0.27 ug/mL-FEU (ref 0.00–0.50)

## 2019-02-25 LAB — TROPONIN I (HIGH SENSITIVITY): Troponin I (High Sensitivity): <2 ng/L (ref <18)

## 2019-02-25 LAB — MAGNESIUM: Magnesium: 2.3 mg/dL (ref 1.7–2.4)

## 2019-02-25 MED ORDER — DEXAMETHASONE SODIUM PHOSPHATE 10 MG/ML IJ SOLN
10.0000 mg | Freq: Once | INTRAMUSCULAR | Status: AC
  Administered 2019-02-25: 10 mg via INTRAVENOUS
  Filled 2019-02-25: qty 1

## 2019-02-25 MED ORDER — BENZONATATE 100 MG PO CAPS
100.0000 mg | ORAL_CAPSULE | Freq: Once | ORAL | Status: AC
  Administered 2019-02-25: 100 mg via ORAL
  Filled 2019-02-25: qty 1

## 2019-02-25 MED ORDER — SODIUM CHLORIDE 0.9 % IV BOLUS
1000.0000 mL | Freq: Once | INTRAVENOUS | Status: AC
  Administered 2019-02-25: 1000 mL via INTRAVENOUS

## 2019-02-25 MED ORDER — AZITHROMYCIN 250 MG PO TABS: ORAL_TABLET | ORAL | 0 refills | Status: DC

## 2019-02-25 MED ORDER — BENZONATATE 100 MG PO CAPS: 100.0000 mg | ORAL_CAPSULE | Freq: Three times a day (TID) | ORAL | 0 refills | Status: AC

## 2019-02-25 MED ORDER — AMOXICILLIN 500 MG PO CAPS: 1000.0000 mg | ORAL_CAPSULE | Freq: Three times a day (TID) | ORAL | 0 refills | Status: DC

## 2019-02-25 MED ORDER — ALBUTEROL SULFATE HFA 108 (90 BASE) MCG/ACT IN AERS
2.0000 | INHALATION_SPRAY | Freq: Once | RESPIRATORY_TRACT | Status: AC
  Administered 2019-02-25: 2 via RESPIRATORY_TRACT
  Filled 2019-02-25: qty 6.7

## 2019-02-25 NOTE — Discharge Instructions (Addendum)
Your lab work has been very reassuring.  Your tray does show concern for possible pneumonia.  Have treated with antibiotics.  Tessalon for cough.  Use the inhaler for any wheezing or shortness of breath that you have.  Motrin and Tylenol for fever.  Please follow-up with your primary care doctor.  Return to the ER with any worsening symptoms.

## 2019-02-25 NOTE — ED Triage Notes (Signed)
covid positive 5 days ago. Reports fevers continue. Denies worsening symptoms.

## 2019-02-27 NOTE — ED Provider Notes (Signed)
[facility] EMERGENCY DEPARTMENT Provider Note   CSN: [id number] Arrival date & time: 02/25/19  1407     History   Chief Complaint Chief Complaint  Patient presents with  . covid    HPI Steven Mccoy is a 33 y.o. male.     HPI 33 year old with no pertinent past medical history presents to the emergency department today for evaluation of ongoing fevers, shortness of breath, chest pain, headaches, body aches.  Patient was diagnosed with COVID-19 5 days ago.  States that his symptoms have persisted despite antidiuretics at home.  Reports having some pleuritic chest pain.  States that he still short of breath with a cough.  Reports generalized body aches.  He reports some lightheadedness and dizziness at times.  He denies any difficulties with urination.  Denies any vision changes, abdominal pain.  Does report some loose stools.  No alleviating or aggravating factors.  Patient denies any cardiac history.  He does report history of hypertension that is well controlled.  Denies any history of PE/DVT, prolonged immobilization, recent hospitalization/surgeries, unilateral leg swelling or calf tenderness. Past Medical History:  Diagnosis Date  . Chronic back pain greater than 3 months duration    Right shoulder pain-chronic    There are no active problems to display for this patient.   Past Surgical History:  Procedure Laterality Date  . MOUTH SURGERY    . RHINOPLASTY  2015  . SEPTOPLASTY  2015  . SHOULDER SURGERY          Home Medications    Prior to Admission medications   Medication Sig Start Date End Date Taking? Authorizing Provider  amoxicillin (AMOXIL) 500 MG capsule Take 2 capsules (1,000 mg total) by mouth 3 (three) times daily. 02/25/19   Rise MuLeaphart, Azyah Flett T, PA-C  azithromycin (ZITHROMAX) 250 MG tablet Take 2 tabs PO x 1 dose, then 1 tab PO QD x 4 days 02/25/19   Demetrios LollLeaphart, Luellen Howson T, PA-C  benzonatate (TESSALON) 100 MG capsule Take 1 capsule  (100 mg total) by mouth every 8 (eight) hours. 02/25/19   Rise MuLeaphart, Monterrio Gerst T, PA-C  chlorpheniramine-HYDROcodone (TUSSIONEX PENNKINETIC ER) 10-8 MG/5ML SUER Take 5 mLs by mouth every 12 (twelve) hours as needed for cough. 02/20/19   Domenick GongMortenson, Ashley, MD  cyclobenzaprine (FLEXERIL) 10 MG tablet Take 10 mg by mouth 3 (three) times daily as needed for muscle spasms.    [provider]  fluticasone (FLONASE) 50 MCG/ACT nasal spray Place 2 sprays into both nostrils daily. 02/20/19   Domenick GongMortenson, Ashley, MD  citalopram (CELEXA) 20 MG tablet Take 20 mg by mouth daily.  12/03/18  [provider]  zolpidem (AMBIEN) 10 MG tablet Take 10 mg by mouth at bedtime as needed for sleep.  12/03/18  [provider]    Family History Family History  Problem Relation Age of Onset  . Hypertension Father   . Diabetes Father   . Heart disease Father     Social History Social History   Tobacco Use  . Smoking status: Current Every Day Smoker    Packs/day: 0.25    Types: Cigarettes  . Smokeless tobacco: Never Used  Substance Use Topics  . Alcohol use: No  . Drug use: No     Allergies   Bee venom   Review of Systems Review of Systems  Constitutional: Positive for chills and fever.  HENT: Positive for congestion and sore throat.   Eyes: Negative for discharge.  Respiratory: Positive for shortness of breath.  Cardiovascular: Positive for chest pain.  Gastrointestinal: Positive for diarrhea and nausea. Negative for abdominal pain and vomiting.  Genitourinary: Negative for dysuria.  Musculoskeletal: Positive for arthralgias and myalgias.  Skin: Negative for color change.  Neurological: Positive for light-headedness and headaches. Negative for dizziness.  Psychiatric/Behavioral: Negative for confusion.     Physical Exam Updated Vital Signs BP (!) 143/87   Pulse 77   Temp 99.1 F (37.3 C) (Oral)   Resp 10   Ht  (1.6 m)   Wt 68 kg   SpO2 97%   BMI 26.57 kg/m    Physical Exam Vitals signs and nursing note reviewed.  Constitutional:      General: He is not in acute distress.    Appearance: He is well-developed. He is not toxic-appearing or diaphoretic.  HENT:     Head: Normocephalic and atraumatic.     Right Ear: Tympanic membrane, ear canal and external ear normal.     Left Ear: Tympanic membrane, ear canal and external ear normal.     Nose: Nose normal.     Mouth/Throat:     Mouth: Mucous membranes are moist.  Eyes:     General:        Right eye: No discharge.        Left eye: No discharge.     Conjunctiva/sclera: Conjunctivae normal.     Pupils: Pupils are equal, round, and reactive to light.  Neck:     Musculoskeletal: Normal range of motion and neck supple. No neck rigidity.     Vascular: No JVD.     Trachea: No tracheal deviation.  Cardiovascular:     Rate and Rhythm: Normal rate and regular rhythm.     Heart sounds: Normal heart sounds. No murmur. No friction rub. No gallop.   Pulmonary:     Effort: Pulmonary effort is normal. No respiratory distress.     Breath sounds: Normal breath sounds. No stridor. No wheezing, rhonchi or rales.  Chest:     Chest wall: No tenderness.  Abdominal:     General: Bowel sounds are normal. There is no distension.     Palpations: Abdomen is soft.     Tenderness: There is no abdominal tenderness. There is no guarding or rebound.  Musculoskeletal: Normal range of motion.     Right lower leg: No edema.     Left lower leg: No edema.     Comments: No lower extremity edema or calf tenderness.  Lymphadenopathy:     Cervical: No cervical adenopathy.  Skin:    General: Skin is warm and dry.     Capillary Refill: Capillary refill takes less than 2 seconds.  Neurological:     Mental Status: He is alert and oriented to person, place, and time.  Psychiatric:        Behavior: Behavior normal.        Thought Content: Thought content normal.        Judgment: Judgment normal.      ED Treatments /  Results  Labs (all labs ordered are listed, but only abnormal results are displayed) Labs Reviewed  COMPREHENSIVE METABOLIC PANEL - Abnormal; Notable for the following components:      Result Value   Calcium 8.7 (*)    All other components within normal limits  CBC WITH DIFFERENTIAL/PLATELET  MAGNESIUM  D-DIMER, QUANTITATIVE (NOT AT [facility])  TROPONIN I (HIGH SENSITIVITY)    EKG EKG Interpretation  Date/Time:  Monday February 25 2019 14:34:56 EST Ventricular  Rate:  84 PR Interval:    QRS Duration: 83 QT Interval:  351 QTC Calculation: 415 R Axis:   67 Text Interpretation: Sinus rhythm RSR' in V1 or V2, probably normal variant Confirmed by Nicanor Alcon, April ([id number]) on 02/26/2019 1:06:03 PM   Radiology Dg Chest Port 1 View  Result Date: 02/25/2019 CLINICAL DATA:  COVID-19 positive.  Cough and fever EXAM: PORTABLE CHEST 1 VIEW COMPARISON:  April 19, 2014 FINDINGS: There is focal ill-defined opacity in the superior left hilar region, likely focal infiltrate in the medial aspect of the right upper lobe. Lungs elsewhere are clear. Heart size and pulmonary vascular normal. No adenopathy. No bone lesions. IMPRESSION: Suspect focal pneumonia in the medial right upper lobe. Lungs elsewhere clear. No adenopathy. Cardiac silhouette normal. Electronically Signed   By: Bretta Bang III M.D.   On: 02/25/2019 16:10    Procedures Procedures (including critical care time)  Medications Ordered in ED Medications  benzonatate (TESSALON) capsule 100 mg (100 mg Oral Given 02/25/19 1459)  dexamethasone (DECADRON) injection 10 mg (10 mg Intravenous Given 02/25/19 1500)  sodium chloride 0.9 % bolus 1,000 mL (0 mLs Intravenous Stopped 02/25/19 1628)  albuterol (VENTOLIN HFA) 108 (90 Base) MCG/ACT inhaler 2 puff (2 puffs Inhalation Given 02/25/19 1458)     Initial Impression / Assessment and Plan / ED Course  I have reviewed the triage vital signs and the nursing notes.  Pertinent labs & imaging  results that were available during my care of the patient were reviewed by me and considered in my medical decision making (see chart for details).        32 year old male presents to the ER for evaluation of ongoing symptoms consistent with COVID-19 infection which she was diagnosed with 5 days ago.  Patient reports some chest pain or shortness of breath.  Vital signs reassuring.  Exam is reassuring.  Work-up is very reassuring.  No leukocytosis.  Normal electrolytes.  Troponin was negative.  D-dimer was normal.  EKG performed shows sinus rhythm with no acute ST changes concerning for ischemia.  Chest x-ray shows focal pneumonia in the medial right upper lobe.  Patient treated with antibiotics and Tessalon.  He is not hypoxic or tachypneic in the ER.  Feel stable for discharge home with treatment in the outpatient setting.  Discussed with patient to continue quarantining and to follow-up with his primary care doctor if needed and return to the ER symptoms worsen.  Pt is hemodynamically stable, in NAD, & able to ambulate in the ED. Evaluation does not show pathology that would require ongoing emergent intervention or inpatient treatment. I explained the diagnosis to the patient. Pain has been managed & has no complaints prior to dc. Pt is comfortable with above plan and is stable for discharge at this time. All questions were answered prior to disposition. Strict return precautions for f/u to the ED were discussed. Encouraged follow up with PCP.    Final Clinical Impressions(s) / ED Diagnoses   Final diagnoses:  COVID-19  Community acquired pneumonia of left lung, unspecified part of lung    ED Discharge Orders         Ordered    amoxicillin (AMOXIL) 500 MG capsule  3 times daily     02/25/19 1620    azithromycin (ZITHROMAX) 250 MG tablet     02/25/19 1620    benzonatate (TESSALON) 100 MG capsule  Every 8 hours     02/25/19 1621  Doristine Devoid, PA-C 02/27/19 1039     Margette Fast, MD 02/27/19 [phone]

## 2019-10-21 ENCOUNTER — Emergency Department: Payer: No Typology Code available for payment source

## 2019-10-21 ENCOUNTER — Emergency Department
Admission: EM | Admit: 2019-10-21 | Discharge: 2019-10-21 | Disposition: A | Discharge status: Home / Self Care | Payer: No Typology Code available for payment source | Attending: Emergency Medicine | Admitting: Emergency Medicine

## 2019-10-21 ENCOUNTER — Other Ambulatory Visit: Payer: Self-pay

## 2019-10-21 DIAGNOSIS — Z8616 Personal history of COVID-19: Secondary | ICD-10-CM | POA: Insufficient documentation

## 2019-10-21 DIAGNOSIS — Z20822 Contact with and (suspected) exposure to covid-19: Secondary | ICD-10-CM | POA: Diagnosis not present

## 2019-10-21 DIAGNOSIS — Z79899 Other long term (current) drug therapy: Secondary | ICD-10-CM | POA: Diagnosis not present

## 2019-10-21 DIAGNOSIS — F17210 Nicotine dependence, cigarettes, uncomplicated: Secondary | ICD-10-CM | POA: Insufficient documentation

## 2019-10-21 DIAGNOSIS — R509 Fever, unspecified: Secondary | ICD-10-CM | POA: Insufficient documentation

## 2019-10-21 HISTORY — DX: Migraine, unspecified, not intractable, without status migrainosus: G43.909

## 2019-10-21 LAB — URINALYSIS, COMPLETE (UACMP) WITH MICROSCOPIC
Bacteria, UA: NONE SEEN
Bilirubin Urine: NEGATIVE
Glucose, UA: NEGATIVE mg/dL
Hgb urine dipstick: NEGATIVE
Ketones, ur: NEGATIVE mg/dL
Leukocytes,Ua: NEGATIVE
Nitrite: NEGATIVE
Protein, ur: NEGATIVE mg/dL
Specific Gravity, Urine: 1.017 (ref 1.005–1.030)
Squamous Epithelial / LPF: NONE SEEN (ref 0–5)
pH: 5 (ref 5.0–8.0)

## 2019-10-21 LAB — SARS CORONAVIRUS 2 BY RT PCR (HOSPITAL ORDER, PERFORMED IN ~~LOC~~ HOSPITAL LAB): SARS Coronavirus 2: NEGATIVE

## 2019-10-21 NOTE — ED Provider Notes (Signed)
[facility] Emergency Department Provider Note  ____________________________________________   None    (approximate)  I have reviewed the triage vital signs and the nursing notes.   HISTORY  Chief Complaint Fever    HPI Steven Mccoy is a 34 y.o. male presents emergency department with [facility] concerns of Covid exposure on Friday night.  States one of his friends test positive and yesterday he started having fever, body aches chills and sweats.  Patient did have Covid back in November 2020.  Patient states it feels the same way.  He has had a secondary pneumonia from the Covid in which she is still seeing a pulmonologist for.  He has not had the vaccine.  He denies any vomiting or diarrhea.  Patient does have a history of prostatitis.  States he is having some difficulty with urination.    Past Medical History:  Diagnosis Date  . Chronic back pain greater than 3 months duration    Right shoulder pain-chronic  . Migraine     There are no problems to display for this patient.   Past Surgical History:  Procedure Laterality Date  . BACK SURGERY    . MOUTH SURGERY    . RHINOPLASTY  2015  . SEPTOPLASTY  2015  . SHOULDER SURGERY      Prior to Admission medications   Medication Sig Start Date End Date Taking? Authorizing Provider  amoxicillin (AMOXIL) 500 MG capsule Take 2 capsules (1,000 mg total) by mouth 3 (three) times daily. 02/25/19   Rise Mu, PA-C  azithromycin (ZITHROMAX) 250 MG tablet Take 2 tabs PO x 1 dose, then 1 tab PO QD x 4 days 02/25/19   Demetrios Loll T, PA-C  benzonatate (TESSALON) 100 MG capsule Take 1 capsule (100 mg total) by mouth every 8 (eight) hours. 02/25/19   Rise Mu, PA-C  chlorpheniramine-HYDROcodone (TUSSIONEX PENNKINETIC ER) 10-8 MG/5ML SUER Take 5 mLs by mouth every 12 (twelve) hours as needed for cough. 02/20/19   Domenick Gong, MD  cyclobenzaprine (FLEXERIL) 10 MG tablet Take 10  mg by mouth 3 (three) times daily as needed for muscle spasms.    [provider]  fluticasone (FLONASE) 50 MCG/ACT nasal spray Place 2 sprays into both nostrils daily. 02/20/19   Domenick Gong, MD  citalopram (CELEXA) 20 MG tablet Take 20 mg by mouth daily.  12/03/18  [provider]  zolpidem (AMBIEN) 10 MG tablet Take 10 mg by mouth at bedtime as needed for sleep.  12/03/18  [provider]    Allergies Bee venom  Family History  Problem Relation Age of Onset  . Hypertension Father   . Diabetes Father   . Heart disease Father     Social History Social History   Tobacco Use  . Smoking status: Current Some Day Smoker    Packs/day: 0.25    Types: Cigarettes  . Smokeless tobacco: Never Used  . Tobacco comment: social Administrator, sports  . Vaping Use: Never used  Substance Use Topics  . Alcohol use: No  . Drug use: No    Review of Systems  Constitutional: Positive fever/chills Eyes: No visual changes. ENT: Positive for scratchy throat but no sore throat. Respiratory: Denies cough Cardiovascular: Denies chest pain Gastrointestinal: Denies abdominal pain Genitourinary: Negative for dysuria. Musculoskeletal: Negative for back pain. Skin: Negative for rash. Psychiatric: no mood changes,     ____________________________________________   PHYSICAL EXAM:  VITAL SIGNS: ED Triage Vitals  Enc  Vitals Group     BP 10/21/19 1505 (!) 137/84     Pulse Rate 10/21/19 1505 64     Resp 10/21/19 1505 18     Temp 10/21/19 1505 98.7 F (37.1 C)     Temp src --      SpO2 10/21/19 1505 99 %     Weight 10/21/19 1507 165 lb (74.8 kg)     Height 10/21/19 1507 5\' 3"  (1.6 m)     Head Circumference --      Peak Flow --      Pain Score 10/21/19 1507 7     Pain Loc --      Pain Edu? --      Excl. in GC? --     Constitutional: Alert and oriented. Well appearing and in no acute distress. Eyes: Conjunctivae are normal.  Head: Atraumatic. Nose:  No congestion/rhinnorhea. Mouth/Throat: Mucous membranes are moist.   Neck:  supple no lymphadenopathy noted Cardiovascular: Normal rate, regular rhythm. Heart sounds are normal Respiratory: Normal respiratory effort.  No retractions, lungs c t a  Abd: soft nontender bs normal all 4 quad GU: deferred Musculoskeletal: FROM all extremities, warm and well perfused Neurologic:  Normal speech and language.  Skin:  Skin is warm, dry and intact. No rash noted. Psychiatric: Mood and affect are normal. Speech and behavior are normal.  ____________________________________________   LABS (all labs ordered are listed, but only abnormal results are displayed)  Labs Reviewed  URINALYSIS, COMPLETE (UACMP) WITH MICROSCOPIC - Abnormal; Notable for the following components:      Result Value   Color, Urine YELLOW (*)    APPearance CLEAR (*)    All other components within normal limits  SARS CORONAVIRUS 2 BY RT PCR (HOSPITAL ORDER, PERFORMED IN [facility])   ____________________________________________   ____________________________________________  RADIOLOGY  Chest x-ray  ____________________________________________   PROCEDURES  Procedure(s) performed: No  Procedures    ____________________________________________   INITIAL IMPRESSION / ASSESSMENT AND PLAN / ED COURSE  Pertinent labs & imaging results that were available during my care of the patient were reviewed by me and considered in my medical decision making (see chart for details).   Patient is 34 year old male with history of Covid and November 2020, complaining of Covid-like symptoms.  Had positive exposure on Friday night.  Physical exam shows patient to appear well.  Vitals are normal remainder exams are unremarkable  Chest x-ray Covid swab, UA  Chest x-ray, Covid swab, and urinalysis are all negative  I did explain the findings to the patient.  He is going to follow-up with his pulmonologist and  urologist in the Monday.  He is to return emergency department worsening.  He was given a work note and a copy of his negative Covid test that his employer will allow him to return to work.  Is is is discharged.      As part of my medical decision making, I reviewed the following data within the electronic MEDICAL RECORD NUMBER Nursing notes reviewed and incorporated, Labs reviewed , Old chart reviewed, Radiograph reviewed , Notes from prior ED visits and [place] Controlled Substance Database  ____________________________________________   FINAL CLINICAL IMPRESSION(S) / ED DIAGNOSES  Final diagnoses:  Exposure to COVID-19 virus      NEW MEDICATIONS STARTED DURING THIS VISIT:  New Prescriptions   No medications on file     Note:  This document was prepared using Dragon voice recognition software and may include unintentional dictation errors.  Faythe Ghee, PA-C 10/21/19 Kathleen Lime, MD 10/21/19 2119

## 2019-10-21 NOTE — ED Notes (Signed)
Pt presents to the ED for fever. Pt states that his friend tested positive for COVID a couple of days ago and states hes been having a fever and body aches since.

## 2019-10-21 NOTE — ED Triage Notes (Addendum)
PT to ED via POV with CC of covid exposure on Friday night.  Starting yesterday pt has had fever, body aches, chills, sweats, and a "minor scratchy throat". PT had Covid in November and st ates this reminds him of that. Last tylenol dose 2hr ago. PT has chronic back pain that is worse right now, states it gets worse with illness.

## 2019-10-21 NOTE — Discharge Instructions (Signed)
Follow-up at the [facility] as needed.  Follow-up with your pulmonologist and urologist.  Return the emergency department if worsening.

## 2019-12-06 ENCOUNTER — Ambulatory Visit
Admission: EM | Admit: 2019-12-06 | Discharge: 2019-12-06 | Disposition: A | Discharge status: Home / Self Care | Payer: Federal, State, Local not specified - PPO

## 2019-12-06 DIAGNOSIS — Z4802 Encounter for removal of sutures: Secondary | ICD-10-CM | POA: Diagnosis not present

## 2019-12-06 NOTE — Discharge Instructions (Addendum)
Two stitches removed today.    Follow up with your primary care provider if your symptoms are not improving.

## 2019-12-06 NOTE — ED Triage Notes (Signed)
Patient reports he got sutures placed in [place] on his upper lip seven days ago.

## 2019-12-06 NOTE — ED Provider Notes (Signed)
Renaldo Fiddler    CSN: [id number] Arrival date & time: 12/06/19  1422      History   Chief Complaint Chief Complaint  Patient presents with  . Suture / Staple Removal    HPI Mickal Meno Kraeger is a 34 y.o. male.   Patient presents with request for removal of his sutures on his left upper lip.  These were placed in [place] after he ran into a cave wall in [place].  He denies fever, chills, pain, drainage, erythema, or other symptoms.  The history is provided by the patient.    Past Medical History:  Diagnosis Date  . Chronic back pain greater than 3 months duration    Right shoulder pain-chronic  . Migraine     There are no problems to display for this patient.   Past Surgical History:  Procedure Laterality Date  . BACK SURGERY    . MOUTH SURGERY    . RHINOPLASTY  2015  . SEPTOPLASTY  2015  . SHOULDER SURGERY         Home Medications    Prior to Admission medications   Medication Sig Start Date End Date Taking? Authorizing Provider  amoxicillin (AMOXIL) 500 MG capsule Take 2 capsules (1,000 mg total) by mouth 3 (three) times daily. 02/25/19   Rise Mu, PA-C  azithromycin (ZITHROMAX) 250 MG tablet Take 2 tabs PO x 1 dose, then 1 tab PO QD x 4 days 02/25/19   Demetrios Loll T, PA-C  benzonatate (TESSALON) 100 MG capsule Take 1 capsule (100 mg total) by mouth every 8 (eight) hours. 02/25/19   Rise Mu, PA-C  chlorpheniramine-HYDROcodone (TUSSIONEX PENNKINETIC ER) 10-8 MG/5ML SUER Take 5 mLs by mouth every 12 (twelve) hours as needed for cough. 02/20/19   Domenick Gong, MD  cyclobenzaprine (FLEXERIL) 10 MG tablet Take 10 mg by mouth 3 (three) times daily as needed for muscle spasms.    [provider]  fluticasone (FLONASE) 50 MCG/ACT nasal spray Place 2 sprays into both nostrils daily. 02/20/19   Domenick Gong, MD  citalopram (CELEXA) 20 MG tablet Take 20 mg by mouth daily.  12/03/18  [provider]    zolpidem (AMBIEN) 10 MG tablet Take 10 mg by mouth at bedtime as needed for sleep.  12/03/18  [provider]    Family History Family History  Problem Relation Age of Onset  . Hypertension Father   . Diabetes Father   . Heart disease Father     Social History Social History   Tobacco Use  . Smoking status: Current Some Day Smoker    Packs/day: 0.25    Types: Cigarettes  . Smokeless tobacco: Never Used  . Tobacco comment: .25 pack per week  Vaping Use  . Vaping Use: Never used  Substance Use Topics  . Alcohol use: No  . Drug use: No     Allergies   Bee venom   Review of Systems Review of Systems  Constitutional: Negative for chills and fever.  HENT: Negative for ear pain and sore throat.   Eyes: Negative for pain and visual disturbance.  Respiratory: Negative for cough and shortness of breath.   Cardiovascular: Negative for chest pain and palpitations.  Gastrointestinal: Negative for abdominal pain and vomiting.  Genitourinary: Negative for dysuria and hematuria.  Musculoskeletal: Negative for arthralgias and back pain.  Skin: Positive for wound. Negative for color change and rash.  Neurological: Negative for seizures and syncope.  All other systems reviewed and are  negative.    Physical Exam Triage Vital Signs ED Triage Vitals  Enc Vitals Group     BP      Pulse      Resp      Temp      Temp src      SpO2      Weight      Height      Head Circumference      Peak Flow      Pain Score      Pain Loc      Pain Edu?      Excl. in GC?    No data found.  Updated Vital Signs BP 128/81   Pulse 68   Temp 98.4 F (36.9 C)   Resp 14   SpO2 99%   Visual Acuity Right Eye Distance:   Left Eye Distance:   Bilateral Distance:    Right Eye Near:   Left Eye Near:    Bilateral Near:     Physical Exam Vitals and nursing note reviewed.  Constitutional:      General: He is not in acute distress.    Appearance: He is well-developed.  HENT:      Head: Normocephalic and atraumatic.     Mouth/Throat:     Mouth: Mucous membranes are moist.     Pharynx: Oropharynx is clear.  Eyes:     Conjunctiva/sclera: Conjunctivae normal.  Cardiovascular:     Rate and Rhythm: Normal rate and regular rhythm.     Heart sounds: No murmur heard.   Pulmonary:     Effort: Pulmonary effort is normal. No respiratory distress.     Breath sounds: Normal breath sounds.  Abdominal:     Palpations: Abdomen is soft.     Tenderness: There is no abdominal tenderness.  Musculoskeletal:     Cervical back: Neck supple.  Skin:    General: Skin is warm and dry.     Comments: 2 sutures in left upper lip; wound appears to be healing well; no drainage or erythema.    Neurological:     General: No focal deficit present.     Mental Status: He is alert and oriented to person, place, and time.     Gait: Gait normal.  Psychiatric:        Mood and Affect: Mood normal.        Behavior: Behavior normal.      UC Treatments / Results  Labs (all labs ordered are listed, but only abnormal results are displayed) Labs Reviewed - No data to display  EKG   Radiology No results found.  Procedures Procedures (including critical care time)  Medications Ordered in UC Medications - No data to display  Initial Impression / Assessment and Plan / UC Course  I have reviewed the triage vital signs and the nursing notes.  Pertinent labs & imaging results that were available during my care of the patient were reviewed by me and considered in my medical decision making (see chart for details).   Removal of 2 sutures.  Wound appears to be healing well.  No indication of infection.  Instructed patient to follow-up with his PCP as needed.  Patient agrees to plan of care.   Final Clinical Impressions(s) / UC Diagnoses   Final diagnoses:  Encounter for removal of sutures     Discharge Instructions     Two stitches removed today.    Follow up with your primary  care provider if your  symptoms are not improving.       ED Prescriptions    None     PDMP not reviewed this encounter.   Mickie Bail, NP 12/06/19 1451

## 2020-08-12 ENCOUNTER — Other Ambulatory Visit: Payer: Self-pay

## 2020-08-12 ENCOUNTER — Other Ambulatory Visit
Admission: RE | Admit: 2020-08-12 | Discharge: 2020-08-12 | Disposition: A | Discharge status: Home / Self Care | Payer: Federal, State, Local not specified - PPO | Attending: Rheumatology | Admitting: Rheumatology

## 2020-08-12 DIAGNOSIS — Z87892 Personal history of anaphylaxis: Secondary | ICD-10-CM | POA: Diagnosis present

## 2020-08-12 DIAGNOSIS — L509 Urticaria, unspecified: Secondary | ICD-10-CM | POA: Diagnosis not present

## 2020-08-12 LAB — CBC WITH DIFFERENTIAL/PLATELET
Abs Immature Granulocytes: 0.02 10*3/uL (ref 0.00–0.07)
Basophils Absolute: 0.1 10*3/uL (ref 0.0–0.1)
Basophils Relative: 1 %
Eosinophils Absolute: 0.1 10*3/uL (ref 0.0–0.5)
Eosinophils Relative: 2 %
HCT: 43.4 % (ref 39.0–52.0)
Hemoglobin: 14.8 g/dL (ref 13.0–17.0)
Immature Granulocytes: 0 %
Lymphocytes Relative: 37 %
Lymphs Abs: 2.8 10*3/uL (ref 0.7–4.0)
MCH: 29.5 pg (ref 26.0–34.0)
MCHC: 34.1 g/dL (ref 30.0–36.0)
MCV: 86.6 fL (ref 80.0–100.0)
Monocytes Absolute: 0.5 10*3/uL (ref 0.1–1.0)
Monocytes Relative: 7 %
Neutro Abs: 4.1 10*3/uL (ref 1.7–7.7)
Neutrophils Relative %: 53 %
Platelets: 207 10*3/uL (ref 150–400)
RBC: 5.01 MIL/uL (ref 4.22–5.81)
RDW: 12.6 % (ref 11.5–15.5)
WBC: 7.5 10*3/uL (ref 4.0–10.5)
nRBC: 0 % (ref 0.0–0.2)

## 2020-08-12 LAB — TSH: TSH: 1.082 u[IU]/mL (ref 0.350–4.500)

## 2020-08-14 LAB — PROTEIN ELECTROPHORESIS, SERUM
A/G Ratio: 1.5 (ref 0.7–1.7)
Albumin ELP: 4.3 g/dL (ref 2.9–4.4)
Alpha-1-Globulin: 0.2 g/dL (ref 0.0–0.4)
Alpha-2-Globulin: 0.7 g/dL (ref 0.4–1.0)
Beta Globulin: 1.1 g/dL (ref 0.7–1.3)
Gamma Globulin: 1 g/dL (ref 0.4–1.8)
Globulin, Total: 2.9 g/dL (ref 2.2–3.9)
Total Protein ELP: 7.2 g/dL (ref 6.0–8.5)

## 2020-08-14 LAB — TRYPTASE: Tryptase: 4.8 ug/L (ref 2.2–13.2)

## 2020-08-19 LAB — MISC LABCORP TEST (SEND OUT)
Labcorp test code: 650001
Labcorp test code: 67710

## 2020-08-19 LAB — IGE: IgE (Immunoglobulin E), Serum: 77 IU/mL (ref 6–495)

## 2020-09-02 LAB — MISC LABCORP TEST (SEND OUT): Labcorp test code: 9985

## 2021-09-14 IMAGING — DX DG CHEST 1V PORT
1 series · 1 of 1 positions shown · non-contrast
Comparison: 02/25/2019

CLINICAL DATA: Cough suspected COVID

EXAM:
PORTABLE CHEST 1 VIEW

[chest ap]
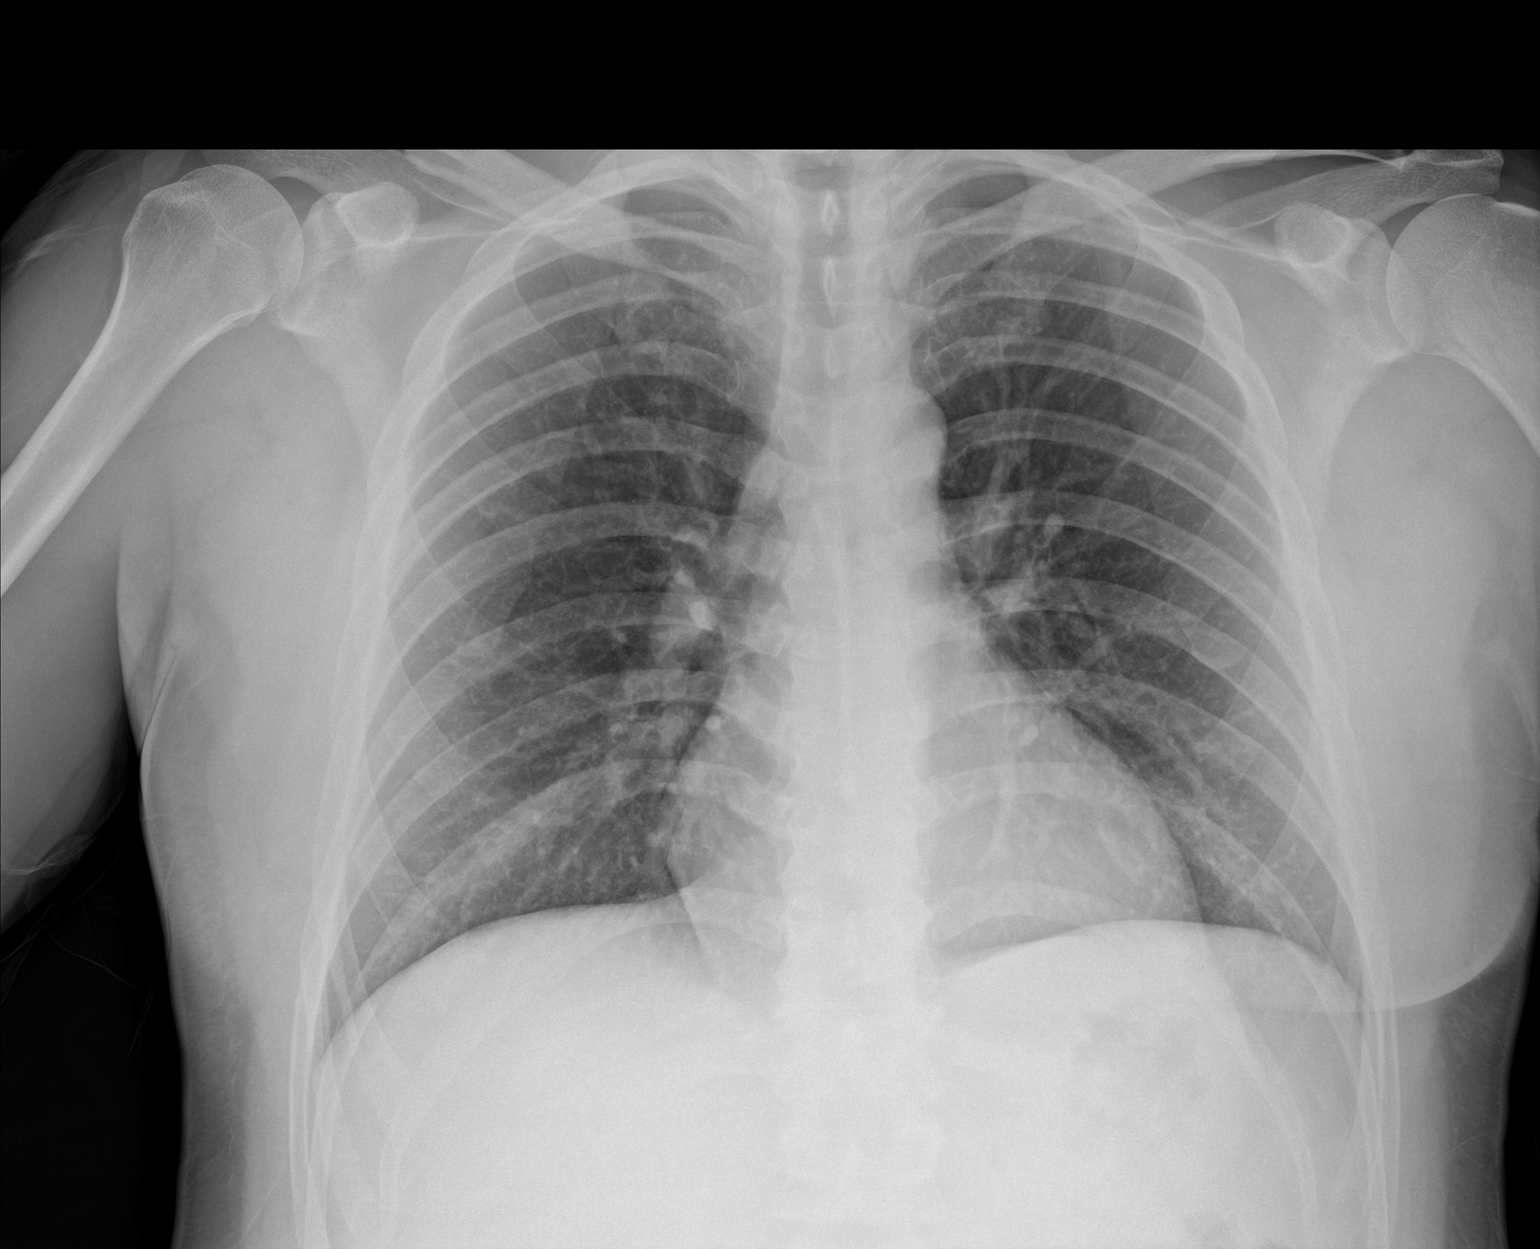

[1 of 1 positions shown; findings below may reference images not displayed]

FINDINGS: The heart size and mediastinal contours are within normal limits.
Both lungs are clear. The visualized skeletal structures are
unremarkable.
IMPRESSION: No active disease.

## 2021-12-11 ENCOUNTER — Ambulatory Visit
Admission: EM | Admit: 2021-12-11 | Discharge: 2021-12-11 | Disposition: A | Discharge status: Home / Self Care | Payer: Federal, State, Local not specified - PPO

## 2021-12-11 ENCOUNTER — Encounter: Payer: Self-pay | Admitting: Emergency Medicine

## 2021-12-11 DIAGNOSIS — T782XXA Anaphylactic shock, unspecified, initial encounter: Secondary | ICD-10-CM

## 2021-12-11 NOTE — Discharge Instructions (Addendum)
Transferred to ED via personal vehicle.

## 2021-12-11 NOTE — ED Notes (Signed)
Patient is being discharged from the Urgent Care and sent to the [facility] Emergency Department via private vehicle with spouse . Per Gertie Baron, NP, patient is in need of higher level of care due to needing further monitor after allergic reaction to bee sting. Patient is aware and verbalizes understanding of plan of care.  Vitals:   12/11/21 1534  BP: (!) 89/63  Pulse: (!) 115  Resp: 20  Temp: 97.9 F (36.6 C)  SpO2: 97%

## 2021-12-11 NOTE — ED Triage Notes (Signed)
Patient states that he was stung by a bee about 15 min ago and gave himself EPipen injection about 15 min ago.  Patient states that he also took 3 pills of Prednisone.  Patient states that he is able to breathe better.  Patient does have a red rash on his chest and arms.

## 2021-12-11 NOTE — ED Provider Notes (Signed)
[facility] - [place], [place]   Name: Steven Mccoy DOB: [birth year] MRN: [id number] CSN: [id number] PCP: Hardie Shackleton, MD  Arrival date and time:  12/11/21 1530  Chief Complaint:  Allergic Reaction, Rash, and Shortness of Breath   NOTE: Prior to seeing the patient today, I have reviewed the triage nursing documentation and vital signs. Clinical staff has updated patient's PMH/PSHx, current medication list, and drug allergies/intolerances to ensure comprehensive history available to assist in medical decision making.   History:   HPI: Steven Mccoy is a 36 y.o. male who presents today with his wife with complaints of anaphylaxis after a bee sting.  Patient was stung by bee approximately 15 minutes ago.  His wife witnessed a sting and administered his EpiPen.  She also gave him 2 Benadryl tablets and 3 steroid pills, exact doses are unknown.  They drove to our facility immediately after providing the medication.  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  He states he noticed some welts to the back of his neck and increased shortness of breath directly after the bee sting, but they are resolving.  He endorses mild shortness of breath.   Past Medical History:  Diagnosis Date   Chronic back pain greater than 3 months duration    Right shoulder pain-chronic   Migraine     Past Surgical History:  Procedure Laterality Date   BACK SURGERY     MOUTH SURGERY     RHINOPLASTY  2015   SEPTOPLASTY  2015   SHOULDER SURGERY      Family History  Problem Relation Age of Onset   Hypertension Father    Diabetes Father    Heart disease Father     Social History   Tobacco Use   Smoking status: Some Days    Packs/day: 0.25    Types: Cigarettes   Smokeless tobacco: Never   Tobacco comments:    .25 pack per week  Vaping Use   Vaping Use: Never used  Substance Use Topics   Alcohol use: No    Drug use: No    There are no problems to display for this patient.   Home Medications:    Current Meds  Medication Sig   EPINEPHrine 0.3 mg/0.3 mL IJ SOAJ injection Inject into the muscle.    Allergies:   Bee venom  Review of Systems (ROS): Review of Systems  Constitutional:  Positive for fatigue.  HENT:  Negative for drooling and trouble swallowing.   Cardiovascular:  Negative for palpitations.  Gastrointestinal:  Negative for diarrhea, nausea and vomiting.  Skin:  Positive for color change.  Allergic/Immunologic: Positive for environmental allergies.  Neurological:  Positive for dizziness and light-headedness.  Psychiatric/Behavioral:  Negative for confusion.      Vital Signs: Today's Vitals   12/11/21 1532 12/11/21 1534  BP:  (!) 89/63  Pulse:  (!) 115  Resp:  20  Temp:  97.9 F (36.6 C)  TempSrc:  Oral  SpO2:  97%  Weight: 164 lb 14.5 oz (74.8 kg)   Height: 5\' 3"  (1.6 m)   PainSc: 0-No pain     Physical Exam: Physical Exam Vitals and nursing note reviewed.  Constitutional:      Appearance: Normal appearance.  Cardiovascular:     Rate and Rhythm: Tachycardia present.     Pulses: Normal pulses.     Heart sounds: Normal heart sounds.  Pulmonary:  Effort: Tachypnea present. No accessory muscle usage.     Breath sounds: Normal breath sounds.  Musculoskeletal:     Right lower leg: No edema.     Left lower leg: No edema.  Skin:    General: Skin is warm.     Coloration: Skin is mottled.  Neurological:     General: No focal deficit present.     Mental Status: He is alert and oriented to person, place, and time.     Comments: Slightly lethargic due to medications.  Psychiatric:        Attention and Perception: Attention normal.        Behavior: Behavior is cooperative.      Urgent Care Treatments / Results:   LABS: PLEASE NOTE: all labs that were ordered this encounter are listed, however only abnormal results are displayed. Labs Reviewed -  No data to display  EKG: -None  RADIOLOGY: No results found.  PROCEDURES: Procedures  MEDICATIONS RECEIVED THIS VISIT: Medications - No data to display  PERTINENT CLINICAL COURSE NOTES/UPDATES:   Initial Impression / Assessment and Plan / Urgent Care Course:  Pertinent labs & imaging results that were available during my care of the patient were personally reviewed by me and considered in my medical decision making (see lab/imaging section of note for values and interpretations).  Steven Mccoy is a 36 y.o. male who presents to [facility] today with complaints of bee sting.  Patient received all the necessary premedications prior to his arrival to this facility.  I believe he needs close monitoring at his local emergency department for possible the administration of epinephrine or steroids due to the severity of his previous anaphylactic reactions.  Vital signs are relatively stable; though initial blood pressure is low mean arterial pressure is greater than 60.  Patient is alert and able to answer questions and ambulate without assistance.  Patient's wife states she will bring him immediately to the emergency department and EMS transport is not needed.   Final Clinical Impressions / Urgent Care Diagnoses:   Final diagnoses:  Anaphylaxis, initial encounter    New Prescriptions:  [place] Controlled Substance Registry consulted? Not Applicable  No orders of the defined types were placed in this encounter.     Discharge Instructions      Transferred to ED via personal vehicle.    Recommended Follow up Care:  Patient encouraged to follow up with the following provider within the specified time frame, or sooner as dictated by the severity of his symptoms. As always, he was instructed that for any urgent/emergent care needs, he should seek care either here or in the emergency department for more immediate evaluation.   Bailey Mech, DNP, NP-c   Bailey Mech, NP 12/11/21 [phone]

## 2023-06-07 ENCOUNTER — Institutional Professional Consult (permissible substitution): Payer: Self-pay | Admitting: Neurology

## 2023-06-07 ENCOUNTER — Encounter: Payer: Self-pay | Admitting: Neurology

## 2023-10-19 ENCOUNTER — Telehealth: Payer: Self-pay

## 2023-10-19 NOTE — Telephone Encounter (Signed)
 Received a message from the front desk regarding Mr. Cedar' appointment with us  on Monday. They stated that his authorization expired in June and wanted to know if we had an updated one.  Looking at the [place] auth it was dated [id number] 05/13/22-09/09/23  .  I called and spoke with the [facility] - I advised that the patient was scheduled for his original appointment on 05/19/2023 and the original appointment date was 06/07/2023. I advised that this appt was no showed and that he was r/s to Monday 7/28. They let me know that the original auth was actually discontinued on 06/30/23. They advised that someone from the [place] called [facility] and lvm requesting a call back regarding the status of this auth. Per the representative, they also sent an email to [place] to follow up on the status of this. When they did not receive any response from our office, the system automatically cancelled the auth.   I called and spoke with the patient regarding this. He is going to reach out to the [place] and see if he can get a new auth sent over STAT to cover the appointment on Monday. He will call us  back as soon as he has an update.

## 2023-10-23 ENCOUNTER — Encounter: Payer: Self-pay | Admitting: Neurology

## 2023-10-23 ENCOUNTER — Ambulatory Visit (INDEPENDENT_AMBULATORY_CARE_PROVIDER_SITE_OTHER): Payer: Self-pay | Admitting: Neurology

## 2023-10-23 VITALS — BP 130/98 | HR 72 | Ht 63.0 in | Wt 154.2 lb

## 2023-10-23 DIAGNOSIS — G4763 Sleep related bruxism: Secondary | ICD-10-CM | POA: Diagnosis not present

## 2023-10-23 DIAGNOSIS — G4719 Other hypersomnia: Secondary | ICD-10-CM | POA: Insufficient documentation

## 2023-10-23 DIAGNOSIS — R0683 Snoring: Secondary | ICD-10-CM | POA: Diagnosis not present

## 2023-10-23 DIAGNOSIS — G4720 Circadian rhythm sleep disorder, unspecified type: Secondary | ICD-10-CM | POA: Diagnosis not present

## 2023-10-23 DIAGNOSIS — F4312 Post-traumatic stress disorder, chronic: Secondary | ICD-10-CM | POA: Insufficient documentation

## 2023-10-23 DIAGNOSIS — R5383 Other fatigue: Secondary | ICD-10-CM | POA: Insufficient documentation

## 2023-10-23 MED ORDER — ALPRAZOLAM 0.5 MG PO TABS: 0.5000 mg | ORAL_TABLET | Freq: Every evening | ORAL | 0 refills | Status: AC | PRN

## 2023-10-23 NOTE — Patient Instructions (Signed)
 PLAN:  In order to work the patient up for possible obstructive sleep apnea in the setting of PTSD I would usually want him to come to the sleep lab and have an in-lab titration if needed for apnea.  I will offer him a low-dose Xanax  prescription to help him fall asleep since our success as a sleep test provider will be dependent on a valid sleep time of at least 4 hours.    I plan to follow up  prn ( if apnea or PLMs are present )  personally or through our NP within 4-6 months.       Quality Sleep Information, Adult Quality sleep is important for your mental and physical health. It also improves your quality of life. Quality sleep means you: Are asleep for most of the time you are in bed. Fall asleep within 30 minutes. Wake up no more than once a night. Are awake for no longer than 20 minutes if you do wake up during the night. Most adults need 7-8 hours of quality sleep each night. How can poor sleep affect me? If you do not get enough quality sleep, you may have: Mood swings. Daytime sleepiness. Decreased alertness, reaction time, and concentration. Sleep disorders, such as insomnia and sleep apnea. Difficulty with: Solving problems. Coping with stress. Paying attention. These issues may affect your performance and productivity at work, school, and home. Lack of sleep may also put you at higher risk for accidents, suicide, and risky behaviors. If you do not get quality sleep, you may also be at higher risk for several health problems, including: Infections. Type 2 diabetes. Heart disease. High blood pressure. Obesity. Worsening of long-term conditions, like arthritis, kidney disease, depression, Parkinson's disease, and epilepsy. What actions can I take to get more quality sleep? Sleep schedule and routine Stick to a sleep schedule. Go to sleep and wake up at about the same time each day. Do not try to sleep less on weekdays and make up for lost sleep on weekends. This does  not work. Limit naps during the day to 30 minutes or less. Do not take naps in the late afternoon. Make time to relax before bed. Reading, listening to music, or taking a hot bath promotes quality sleep. Make your bedroom a place that promotes quality sleep. Keep your bedroom dark, quiet, and at a comfortable room temperature. Make sure your bed is comfortable. Avoid using electronic devices that give off bright blue light for 30 minutes before bedtime. Your brain perceives bright blue light as sunlight. This includes television, phones, and computers. If you are lying awake in bed for longer than 20 minutes, get up and do a relaxing activity until you feel sleepy. Lifestyle     Try to get at least 30 minutes of exercise on most days. Do not exercise 2-3 hours before going to bed. Do not use any products that contain nicotine or tobacco. These products include cigarettes, chewing tobacco, and vaping devices, such as e-cigarettes. If you need help quitting, ask your health care provider. Do not drink caffeinated beverages for at least 8 hours before going to bed. Coffee, tea, and some sodas contain caffeine. Do not drink alcohol or eat large meals close to bedtime. Try to get at least 30 minutes of sunlight every day. Morning sunlight is best. Medical concerns Work with your health care provider to treat medical conditions that may affect sleeping, such as: Nasal obstruction. Snoring. Sleep apnea and other sleep disorders. Talk to your health  care provider if you think any of your prescription medicines may cause you to have difficulty falling or staying asleep. If you have sleep problems, talk with a sleep consultant. If you think you have a sleep disorder, talk with your health care provider about getting evaluated by a specialist. Where to find more information Sleep Foundation: sleepfoundation.org American Academy of Sleep Medicine: aasm.org Centers for Disease Control and Prevention  (CDC): TonerPromos.no Contact a health care provider if: You have trouble getting to sleep or staying asleep. You often wake up very early in the morning and cannot get back to sleep. You have daytime sleepiness. You have daytime sleep attacks of suddenly falling asleep and sudden muscle weakness (narcolepsy). You have a tingling sensation in your legs with a strong urge to move your legs (restless legs syndrome). You stop breathing briefly during sleep (sleep apnea). You think you have a sleep disorder or are taking a medicine that is affecting your quality of sleep. Summary Most adults need 7-8 hours of quality sleep each night. Getting enough quality sleep is important for your mental and physical health. Make your bedroom a place that promotes quality sleep, and avoid things that may cause you to have poor sleep, such as alcohol, caffeine, smoking, or large meals. Talk to your health care provider if you have trouble falling asleep or staying asleep. This information is not intended to replace advice given to you by your health care provider. Make sure you discuss any questions you have with your health care provider. Document Revised: 07/07/2021 Document Reviewed: 07/07/2021 Elsevier Patient Education  2024 [employer].

## 2023-10-23 NOTE — Progress Notes (Signed)
 SLEEP MEDICINE CLINIC    Provider:  Dedra Gores, MD  Primary Care Physician:  Steven Sueellen Pate, MD [address] [place] [address] [place] [address] [place] [address]     Referring Provider: Tomi Rhyme, Fnp [address] [place],  [place] [address]          Chief Complaint according to patient   Patient presents with:     New Patient (Initial Visit)           HISTORY OF PRESENT ILLNESS:  Steven Mccoy is a 38 y.o. male patient who is seen upon referral on 10/23/2023 from [place] for a sleep medicine evaluation .  Chief concern according to patient :   I came back 2012 after my second [place] deployment, total time of 18 months.    I have the pleasure of seeing Steven Mccoy on  10/23/23 a right-handed male [name] with a possible sleep disorder.    The patient had a home sleep study in 2016 through the [place] . Following that he underwent septo- and rhino plasty,  allergic rhinitis, and burn pit syndrome  Nocturia  with prostatitis, Sleep talking on Ambien , magnesium helps  back  pain, oral candidiasis, deviated septum repair.  Chest surgery- mass in male breast 2 months, has Mammograms regularly.   Family medical /sleep history: no other family member on CPAP with OSA.   Social history:   Steven Mccoy, came to [place]  at age 21. 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 2 dogs.  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 He never felt normal  again.   Tobacco use: hookah/ 1-2 a year.  Quit in 2023  ETOH use : none ,  Caffeine intake in form of Coffee( 2/ d) Soda( /) . Exercise in form of walking , 2 miles .Steven Mccoy   Hobbies : yard work.       Sleep habits are as follows: The patient's dinner time is between 6-7 PM.  Intermediate fasting  The patient goes to bed at 10-11 PM and continues to sleep for 6-8 hours, wakes for 1-2 bathroom breaks, the first time at 2 AM.   The preferred sleep position is supine, with the support of 1 pillow.  Dreams are reportedly frequent/vivid. PTSD.   The patient wakes up sometimes spontaneously/sets an alarm for 7.15 AM. 7.20 AM is the usual rise time. He reports not feeling refreshed or restored in AM, with symptoms such as dry mouth, morning headaches, his feet feel heavy, and stiffness throughout the back, residual fatigue.  Naps are taken frequently,  30-40  minutes after lunch,  and 20 minutes after 5 PM  and are more refreshing than nocturnal sleep.    Review of Systems: Out of a complete 14 system review, the patient complains of only the following symptoms, and all other reviewed systems are negative.:  Fatigue, sleepiness , snoring, fragmented sleep, Insomnia, RLS, Nocturia    How likely are you to doze in the following situations: 0 = not likely, 1 = slight chance, 2 = moderate chance, 3 = high chance   Sitting and Reading? Watching Television? Sitting inactive in a public place (theater or meeting)? As a passenger in a car  for an hour without a break? Lying down in the afternoon when circumstances permit? Sitting and talking to someone? Sitting quietly after lunch without alcohol? In a car, while stopped for a few minutes in traffic?   Total = 20/ 24 points   FSS endorsed at 52/ 63 points.   Social History   Socioeconomic History   Marital status: Single    Spouse name: Not on file   Number of children: Not on file   Years of education: Not on file   Highest education level: Not on file  Occupational History   Not on file  Tobacco Use   Smoking status: Some Days    Current packs/day: 0.25    Types: Cigarettes   Smokeless tobacco: Never   Tobacco comments:    .25 pack per week  Vaping Use   Vaping status: Never Used  Substance and Sexual  Activity   Alcohol use: No   Drug use: No   Sexual activity: Not on file  Other Topics Concern   Not on file  Social History Narrative   Not on file   Social Drivers of Health   Financial Resource Strain: Not on file  Food Insecurity: Not on file  Transportation Needs: Not on file  Physical Activity: Not on file  Stress: Not on file  Social Connections: Not on file    Family History  Problem Relation Age of Onset   Hypertension Father    Diabetes Father    Heart disease Father     Past Medical History:  Diagnosis Date   Chronic back pain greater than 3 months duration    Right shoulder pain-chronic   Migraine     Past Surgical History:  Procedure Laterality Date   BACK SURGERY     MOUTH SURGERY     RHINOPLASTY  2015   SEPTOPLASTY  2015   SHOULDER SURGERY       Current Outpatient Medications on File Prior to Visit  Medication Sig Dispense Refill   clonazePAM (KLONOPIN) 0.5 MG tablet Take 0.5 mg by mouth daily.     cyclobenzaprine (FLEXERIL) 10 MG tablet Take 10 mg by mouth 3 (three) times daily as needed for muscle spasms.     DULoxetine (CYMBALTA) 20 MG capsule Take 30 mg by mouth daily.     EPINEPHrine  0.3 mg/0.3 mL IJ SOAJ injection Inject into the muscle.     fluticasone  (FLONASE ) 50 MCG/ACT nasal spray Place 2 sprays into both nostrils daily. (Patient taking differently: Place 2 sprays into both nostrils daily as needed for allergies or rhinitis.) 16 g 0   olopatadine (PATANOL) 0.1 % ophthalmic solution Place 2 drops into both eyes 2 (two) times daily.     tadalafil (CIALIS) 20 MG tablet Take 20 mg by mouth daily as needed for erectile dysfunction.     benzonatate  (TESSALON ) 100 MG capsule Take 1 capsule (100 mg total) by mouth every 8 (eight) hours. (Patient not taking: Reported on 10/23/2023) 15 capsule 0   [DISCONTINUED] zolpidem (AMBIEN) 10 MG tablet Take 10 mg by mouth at bedtime as needed for sleep.     No current facility-administered medications on  file prior to visit.    Allergies  Allergen Reactions   Bee Venom Anaphylaxis   Penicillins Anaphylaxis   Wasp Venom Protein Anaphylaxis and Other (See Comments)   Iodine Rash    Lasts for days     DIAGNOSTIC DATA (LABS, IMAGING, TESTING) - I reviewed patient records, labs, notes, testing  and imaging myself where available.  Lab Results  Component Value Date   WBC 7.5 08/12/2020   HGB 14.8 08/12/2020   HCT 43.4 08/12/2020   MCV 86.6 08/12/2020   PLT 207 08/12/2020      Component Value Date/Time   NA 139 02/25/2019 1453   K 3.8 02/25/2019 1453   CL 108 02/25/2019 1453   CO2 23 02/25/2019 1453   GLUCOSE 95 02/25/2019 1453   BUN 11 02/25/2019 1453   CREATININE 1.03 02/25/2019 1453   CALCIUM 8.7 (L) 02/25/2019 1453   PROT 7.7 02/25/2019 1453   ALBUMIN 4.3 02/25/2019 1453   AST 20 02/25/2019 1453   ALT 22 02/25/2019 1453   ALKPHOS 47 02/25/2019 1453   BILITOT 0.4 02/25/2019 1453   GFRNONAA >60 02/25/2019 1453   GFRAA >60 02/25/2019 1453   No results found for: CHOL, HDL, LDLCALC, LDLDIRECT, TRIG, CHOLHDL No results found for: YHAJ8R No results found for: VITAMINB12 Lab Results  Component Value Date   TSH 1.082 08/12/2020    PHYSICAL EXAM:  Today's Vitals   10/23/23 0916  BP: (!) 130/98  Pulse: 72  Weight: 154 lb 3.2 oz (69.9 kg)  Height: 5' 3 (1.6 m)   Body mass index is 27.32 kg/m.   Wt Readings from Last 3 Encounters:  10/23/23 154 lb 3.2 oz (69.9 kg)  12/11/21 164 lb 14.5 oz (74.8 kg)  10/21/19 165 lb (74.8 kg)     Ht Readings from Last 3 Encounters:  10/23/23 5' 3 (1.6 m)  12/11/21 5' 3 (1.6 m)  10/21/19 5' 3 (1.6 m)      General: The patient is awake, alert and appears not in acute distress. I feel beat, I feel broken  . Bruxism.  Head: Normocephalic, atraumatic.  Neck is supple.  Mallampati 1-2,  neck circumference:16 inches .  Nasal airflow  patent.  Retrognathia is  seen.  Dental status: wisdom teeth removed.  Biological.  Cardiovascular:  Regular rate and cardiac rhythm by pulse,  without distended neck veins. Respiratory: Lungs are clear to auscultation.  Skin:  Without evidence of ankle edema, or rash. Trunk: The patient's posture is erect.   NEUROLOGIC EXAM: The patient is awake and alert, oriented to place and time.   Memory subjective described as intact.  Attention span & concentration ability appears normal.  Speech is fluent,  without  dysarthria, dysphonia or aphasia.  Mood and affect are appropriate.   Cranial nerves: no loss of smell or taste reported  Pupils are equal and briskly reactive to light. Funduscopic exam deferred.  Extraocular movements were intact and without nystagmus.  No Diplopia. Visual fields by finger perimetry are intact. Hearing was intact to soft voice but he has tinnitus- non pulsatile.  Facial sensation intact to fine touch.  Facial motor strength is symmetric and tongue and uvula move midline.  Neck ROM : rotation, tilt and flexion extension were normal for age . Right shoulder injury in service, 2015,    Motor exam:  Symmetric bulk, tone and ROM.   Normal tone without cog -wheeling, symmetric grip strength .   Sensory:  Fine touch and vibration were intact  Proprioception tested in the upper extremities was normal.   Coordination: Rapid alternating movements in the fingers/hands were of normal speed.  The Finger-to-nose maneuver was intact without evidence of ataxia, dysmetria or tremor.   Gait and station: Patient could rise unassisted from a seated position, walked without assistive device.  Stance is of normal width/  base and the patient turned with 3 steps.  Toe and heel walk were deferred.  Deep tendon reflexes: in the upper and lower extremities are symmetric and intact.  Babinski response was deferred .    ASSESSMENT AND PLAN:  69 -year- old male  here with:    1) Mr. Faucett was referred by the [facility] medical provider  to be evaluated for possible sleep apnea.  The patient also reports that he is excessively daytime sleepy as reflected in his Epworth Sleepiness Scale, he also endorsed a high level of fatigue on the fatigue severity score.  He does not have anatomical risk factors except for a slightly enlarged neck size which is still very much in normal range for a male patient.  His BMI is 27 his neck is 16 inches, his tongue is larger.    This patient reports having had multiple environmental allergies as well as allergies to insects, he had multiple injuries including back injuries, right shoulder injury, and hip injury.  These were related to his service in [place] and [place].  The patient underwent a rhinoplasty and septoplasty after a home sleep test in 2016 had been provided and interpreted through the [place].  This has helped for a while but he now again feels that he has difficulties breathing through his nose, he is snoring. Dry Mouth. He reports bruxism  His back pain and shoulder also limit his sleep position at night.  The patient was doing his first deployment working nights and could not sleep and daytime he reports.  He was treated with Ambien which led to sleepwalking.  He feels that his circadian rhythm never returned to normal.  He has the [place] approved diagnosis of posttraumatic stress disorder.  He reports vivid dreams reliving situations in his dreams.   PLAN:  In order to work the patient up for possible obstructive sleep apnea in the setting of PTSD I would usually want him to come to the sleep lab and have an in-lab titration if needed for apnea.  I will offer him a low-dose Xanax  prescription to help him fall asleep since our success as a sleep test provider will be dependent on a valid sleep time of at least 4 hours.    I plan to follow up  prn ( if apnea or PLMs are present )  personally or through our NP within 4-6 months.   I would like to thank Steven, Sueellen Pate, MD and [place],  [place], Fnp [address] [place],  [place] [address] for allowing me to meet with and to take care of this pleasant patient.   Discussion of sleep hygiene setting bedtime and rise time,  hot shower  before bed time, no screen light in the bedroom, the bedroom should be cool, quiet and dark. Night lights should illuminate the floor not shine into your eyes. Golden glow light is less intrusive than blue or cold light. Read in a book with pages, not on a device. Consider audio books and soothing  sound -scapes rather than Tv .   CC: I will share [facility] notes with [facility] provider. .  After spending a total time of  45  minutes face to face and additional time for physical and neurologic examination, review of laboratory studies,  personal review of imaging studies, reports and results of other testing and review of referral information / records as far as provided in visit,   Electronically signed by: Dedra Gores, MD 10/23/2023 9:19 AM  [facility] Board certified by [employer] of Sleep Medicine and Diplomate of the [employer] of Sleep Medicine. Board certified In Neurology through the [employer], Fellow of the [employer] of Neurology.

## 2023-10-26 NOTE — Telephone Encounter (Signed)
 Sleep Lab received orders for a Split Night Sleep Study for this [facility]. The sleep lab did not receive a new authorization for this patient, or if a new auth was obtained a copy was not given to the sleep lab. I have reached out to a contact at the [facility] to ask for clarification on what needs to be done for him to receive the authorization for the sleep study. Once obtained we will move forward with scheduling for the sleep study.
